# Patient Record
Sex: Female | Born: 1985 | Race: White | Hispanic: No
Health system: Southern US, Community
[De-identification: ages and names within clinical notes are randomized; demographics above are authoritative.]

## PROBLEM LIST (undated history)

## (undated) DIAGNOSIS — R011 Cardiac murmur, unspecified: Secondary | ICD-10-CM

## (undated) HISTORY — PX: TUBAL LIGATION: SHX77

## (undated) HISTORY — PX: APPENDECTOMY: SHX54

---
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
## 2019-12-19 ENCOUNTER — Other Ambulatory Visit: Payer: Self-pay

## 2019-12-19 ENCOUNTER — Other Ambulatory Visit: Payer: Self-pay | Admitting: Critical Care Medicine

## 2019-12-19 DIAGNOSIS — Z20822 Contact with and (suspected) exposure to covid-19: Secondary | ICD-10-CM

## 2019-12-22 LAB — NOVEL CORONAVIRUS, NAA: SARS-CoV-2, NAA: NOT DETECTED

## 2020-08-27 ENCOUNTER — Encounter (HOSPITAL_COMMUNITY): Payer: Self-pay

## 2020-08-27 ENCOUNTER — Other Ambulatory Visit: Payer: Self-pay

## 2020-08-27 ENCOUNTER — Emergency Department (HOSPITAL_COMMUNITY)
Admission: EM | Admit: 2020-08-27 | Discharge: 2020-08-28 | Disposition: A | Discharge status: Home / Self Care | Payer: Self-pay | Attending: Emergency Medicine | Admitting: Emergency Medicine

## 2020-08-27 ENCOUNTER — Emergency Department (HOSPITAL_COMMUNITY): Payer: Self-pay

## 2020-08-27 DIAGNOSIS — M79606 Pain in leg, unspecified: Secondary | ICD-10-CM | POA: Insufficient documentation

## 2020-08-27 DIAGNOSIS — X501XXA Overexertion from prolonged static or awkward postures, initial encounter: Secondary | ICD-10-CM | POA: Insufficient documentation

## 2020-08-27 DIAGNOSIS — Z5321 Procedure and treatment not carried out due to patient leaving prior to being seen by health care provider: Secondary | ICD-10-CM | POA: Insufficient documentation

## 2020-08-27 NOTE — ED Notes (Signed)
Pt told this tech "I'm just letting y'all know I am leaving, I have been up for over 24 hours." Moving OTF.

## 2020-08-27 NOTE — ED Provider Notes (Signed)
Emergency Medicine Provider Triage Evaluation Note  Erika Morris , a 35 y.o. female  was evaluated in triage.  Pt complains of left leg pain.  Patient states that she stepped off her porch wrong 1 week ago and thought everything was fine, has been ambulatory without difficulty until yesterday when she started having severe pain in her leg, has been limping and now has pain radiating up into her hip..  Review of Systems  Positive: Leg pain Negative: Numbness  Physical Exam  BP 126/87 (BP Location: Left Arm)   Pulse (!) 103   Temp 98.3 F (36.8 C) (Oral)   Resp 16   SpO2 97%  Gen:   Awake, no distress   Resp:  Normal effort  MSK:   TTP medial left ankle and medial lower leg Other:    Medical Decision Making  Medically screening exam initiated at 8:58 PM.  Appropriate orders placed.  Princesa Willig was informed that the remainder of the evaluation will be completed by another provider, this initial triage assessment does not replace that evaluation, and the importance of remaining in the ED until their evaluation is complete.     Jeannie Fend, PA-C 08/27/20 2059    Benjiman Core, MD 08/27/20 [phone]

## 2020-08-27 NOTE — ED Triage Notes (Signed)
Patient reports she stepped off a curb wrong last week, states it was feeling okay and then today woke up with L ankle and leg pain

## 2020-08-28 ENCOUNTER — Emergency Department (HOSPITAL_COMMUNITY): Payer: Self-pay

## 2020-08-28 ENCOUNTER — Emergency Department (HOSPITAL_BASED_OUTPATIENT_CLINIC_OR_DEPARTMENT_OTHER)
Admission: EM | Admit: 2020-08-28 | Discharge: 2020-08-29 | Disposition: A | Discharge status: Home / Self Care | Payer: Self-pay | Attending: Emergency Medicine | Admitting: Emergency Medicine

## 2020-08-28 ENCOUNTER — Encounter (HOSPITAL_BASED_OUTPATIENT_CLINIC_OR_DEPARTMENT_OTHER): Payer: Self-pay | Admitting: Emergency Medicine

## 2020-08-28 ENCOUNTER — Emergency Department (HOSPITAL_BASED_OUTPATIENT_CLINIC_OR_DEPARTMENT_OTHER): Payer: Self-pay

## 2020-08-28 ENCOUNTER — Other Ambulatory Visit: Payer: Self-pay

## 2020-08-28 DIAGNOSIS — M25552 Pain in left hip: Secondary | ICD-10-CM | POA: Insufficient documentation

## 2020-08-28 DIAGNOSIS — R200 Anesthesia of skin: Secondary | ICD-10-CM | POA: Insufficient documentation

## 2020-08-28 DIAGNOSIS — R937 Abnormal findings on diagnostic imaging of other parts of musculoskeletal system: Secondary | ICD-10-CM | POA: Insufficient documentation

## 2020-08-28 DIAGNOSIS — R531 Weakness: Secondary | ICD-10-CM | POA: Insufficient documentation

## 2020-08-28 DIAGNOSIS — W19XXXA Unspecified fall, initial encounter: Secondary | ICD-10-CM | POA: Insufficient documentation

## 2020-08-28 DIAGNOSIS — Z20822 Contact with and (suspected) exposure to covid-19: Secondary | ICD-10-CM | POA: Insufficient documentation

## 2020-08-28 DIAGNOSIS — F17210 Nicotine dependence, cigarettes, uncomplicated: Secondary | ICD-10-CM | POA: Insufficient documentation

## 2020-08-28 LAB — URINALYSIS, ROUTINE W REFLEX MICROSCOPIC
Bilirubin Urine: NEGATIVE
Glucose, UA: NEGATIVE mg/dL
Ketones, ur: NEGATIVE mg/dL
Leukocytes,Ua: NEGATIVE
Nitrite: NEGATIVE
Protein, ur: NEGATIVE mg/dL
Specific Gravity, Urine: <1.005 — ABNORMAL LOW (ref 1.005–1.030)
pH: 6 (ref 5.0–8.0)

## 2020-08-28 LAB — COMPREHENSIVE METABOLIC PANEL
ALT: 94 U/L — ABNORMAL HIGH (ref 0–44)
AST: 175 U/L — ABNORMAL HIGH (ref 15–41)
Albumin: 4.4 g/dL (ref 3.5–5.0)
Alkaline Phosphatase: 81 U/L (ref 38–126)
Anion gap: 13 (ref 5–15)
BUN: 5 mg/dL — ABNORMAL LOW (ref 6–20)
CO2: 24 mmol/L (ref 22–32)
Calcium: 9.3 mg/dL (ref 8.9–10.3)
Chloride: 102 mmol/L (ref 98–111)
Creatinine, Ser: 0.35 mg/dL — ABNORMAL LOW (ref 0.44–1.00)
GFR, Estimated: >60 mL/min (ref >60)
Glucose, Bld: 76 mg/dL (ref 70–99)
Potassium: 3.8 mmol/L (ref 3.5–5.1)
Sodium: 139 mmol/L (ref 135–145)
Total Bilirubin: 0.9 mg/dL (ref 0.3–1.2)
Total Protein: 8.2 g/dL — ABNORMAL HIGH (ref 6.5–8.1)

## 2020-08-28 LAB — DIFFERENTIAL
Abs Immature Granulocytes: 0.03 10*3/uL (ref 0.00–0.07)
Basophils Absolute: 0 10*3/uL (ref 0.0–0.1)
Basophils Relative: 1 %
Eosinophils Absolute: 0.1 10*3/uL (ref 0.0–0.5)
Eosinophils Relative: 4 %
Immature Granulocytes: 1 %
Lymphocytes Relative: 37 %
Lymphs Abs: 1.2 10*3/uL (ref 0.7–4.0)
Monocytes Absolute: 0.4 10*3/uL (ref 0.1–1.0)
Monocytes Relative: 14 %
Neutro Abs: 1.4 10*3/uL — ABNORMAL LOW (ref 1.7–7.7)
Neutrophils Relative %: 43 %

## 2020-08-28 LAB — RAPID URINE DRUG SCREEN, HOSP PERFORMED
Amphetamines: NOT DETECTED
Barbiturates: NOT DETECTED
Benzodiazepines: NOT DETECTED
Cocaine: NOT DETECTED
Opiates: NOT DETECTED
Tetrahydrocannabinol: NOT DETECTED

## 2020-08-28 LAB — PREGNANCY, URINE: Preg Test, Ur: NEGATIVE

## 2020-08-28 LAB — APTT: aPTT: 32 seconds (ref 24–36)

## 2020-08-28 LAB — CBC
HCT: 39.8 % (ref 36.0–46.0)
Hemoglobin: 13.7 g/dL (ref 12.0–15.0)
MCH: 33.8 pg (ref 26.0–34.0)
MCHC: 34.4 g/dL (ref 30.0–36.0)
MCV: 98.3 fL (ref 80.0–100.0)
Platelets: 78 10*3/uL — ABNORMAL LOW (ref 150–400)
RBC: 4.05 MIL/uL (ref 3.87–5.11)
RDW: 11.5 % (ref 11.5–15.5)
WBC: 3.3 10*3/uL — ABNORMAL LOW (ref 4.0–10.5)
nRBC: 0 % (ref 0.0–0.2)

## 2020-08-28 LAB — PROTIME-INR
INR: 0.9 (ref 0.8–1.2)
Prothrombin Time: 12.4 seconds (ref 11.4–15.2)

## 2020-08-28 LAB — RESP PANEL BY RT-PCR (FLU A&B, COVID) ARPGX2
Influenza A by PCR: NEGATIVE
Influenza B by PCR: NEGATIVE
SARS Coronavirus 2 by RT PCR: NEGATIVE

## 2020-08-28 MED ORDER — LORAZEPAM 2 MG/ML IJ SOLN
1.0000 mg | Freq: Once | INTRAMUSCULAR | Status: AC
  Administered 2020-08-28: 1 mg via INTRAVENOUS
  Filled 2020-08-28: qty 1

## 2020-08-28 MED ORDER — GADOBUTROL 1 MMOL/ML IV SOLN
6.0000 mL | Freq: Once | INTRAVENOUS | Status: AC | PRN
  Administered 2020-08-28: 6 mL via INTRAVENOUS

## 2020-08-28 MED ORDER — KETOROLAC TROMETHAMINE 30 MG/ML IJ SOLN
15.0000 mg | Freq: Once | INTRAMUSCULAR | Status: AC
  Administered 2020-08-28: 15 mg via INTRAVENOUS
  Filled 2020-08-28: qty 1

## 2020-08-28 NOTE — ED Notes (Signed)
Dr. Rhunette Croft completed first [facility]. On checking modified. Pt had left sided deficits to extinction  left arm, leg and shoulder. Drift to left arm and  Pt unable to feel touch to left leg. Left pupil appears slightly larger than right. Pt failed stroke swallow screen. Pt complains of intermittent headaches Dr. Rhunette Croft notified of same. Pt transported to CT

## 2020-08-28 NOTE — ED Triage Notes (Signed)
Approximately a week ago the Pt stepped off a curb and woke up with left leg pain to her knee and hip.

## 2020-08-28 NOTE — ED Notes (Signed)
Report called to Consulting civil engineer at [facility]

## 2020-08-28 NOTE — ED Triage Notes (Signed)
Pt here via [facility] from [facility].  She was initially seen for L leg pain r/t fall several weeks ago.  However, while being assessed at [facility], she noted decreased L arm strength and failed her swallow screen.  Pt ao x 4.

## 2020-08-28 NOTE — ED Provider Notes (Addendum)
[facility] EMERGENCY DEPT Provider Note   CSN: [id number] Arrival date & time: 08/28/20  1143     History Chief Complaint  Patient presents with  . Leg Injury    Erika Morris is a 35 y.o. female.  HPI    35 year old female comes in a chief complaint of leg injury. She reports that Wednesday before last, she had a fall while she was getting ready to go to work.  She has been hobbling over her left lower extremity since then, but now she is having hip pain and more difficulty walking, prompting her to come to the ER.  On our exam, it appears that patient is also having numbness on the left side.  She denies any new back pain, reports chronic pain history to her back.  She has no medical history, but does not see PCP.  She smokes at least a pack of cigarettes every day and has been doing that for the last 15 to 19 years.  Patient denies any chest pain, headaches, visual deficits, new back pain, bilateral extremity symptoms such as weakness or numbness.  History reviewed. No pertinent past medical history.  There are no problems to display for this patient.   Past Surgical History:  Procedure Laterality Date  . APPENDECTOMY       OB History   No obstetric history on file.     No family history on file.  Social History   Substance Use Topics  . Alcohol use: Yes    Comment: Socail drink  . Drug use: Yes    Types: Marijuana    Home Medications Prior to Admission medications   Not on File    Allergies    Patient has no known allergies.  Review of Systems   Review of Systems  Constitutional: Positive for activity change.  Respiratory: Negative for shortness of breath.   Cardiovascular: Negative for chest pain.  Gastrointestinal: Negative for nausea and vomiting.  Musculoskeletal: Positive for arthralgias.  Allergic/Immunologic: Negative for immunocompromised state.  Neurological: Positive for weakness.  Hematological: Bruises/bleeds  easily.  All other systems reviewed and are negative.   Physical Exam Updated Vital Signs BP 125/89   Pulse 70   Temp 99 F (37.2 C) (Oral)   Resp 14   Ht 5\' 8"  (1.727 m)   Wt 61.2 kg   LMP 08/25/2020   SpO2 99%   BMI 20.53 kg/m   Physical Exam Vitals and nursing note reviewed.  Constitutional:      Appearance: She is well-developed.  HENT:     Head: Normocephalic and atraumatic.  Eyes:     Pupils: Pupils are equal, round, and reactive to light.  Cardiovascular:     Rate and Rhythm: Normal rate and regular rhythm.     Heart sounds: Normal heart sounds. No murmur heard.   Pulmonary:     Effort: Pulmonary effort is normal. No respiratory distress.  Abdominal:     General: There is no distension.     Palpations: Abdomen is soft.     Tenderness: There is no abdominal tenderness. There is no guarding or rebound.  Musculoskeletal:     Cervical back: Neck supple.  Skin:    General: Skin is warm and dry.  Neurological:     Mental Status: She is alert and oriented to person, place, and time.     Comments: Left-sided hemisensory deficit over the face, upper and lower extremity.  Lower extremity appears to be impacted but worse.  Left  upper and lower extremity weakness, with drift of the left upper extremity     ED Results / Procedures / Treatments   Labs (all labs ordered are listed, but only abnormal results are displayed) Labs Reviewed  CBC - Abnormal; Notable for the following components:      Result Value   WBC 3.3 (*)    Platelets 78 (*)    All other components within normal limits  DIFFERENTIAL - Abnormal; Notable for the following components:   Neutro Abs 1.4 (*)    All other components within normal limits  COMPREHENSIVE METABOLIC PANEL - Abnormal; Notable for the following components:   BUN 5 (*)    Creatinine, Ser 0.35 (*)    Total Protein 8.2 (*)    AST 175 (*)    ALT 94 (*)    All other components within normal limits  URINALYSIS, ROUTINE W REFLEX  MICROSCOPIC - Abnormal; Notable for the following components:   Color, Urine COLORLESS (*)    Specific Gravity, Urine <1.005 (*)    Hgb urine dipstick SMALL (*)    Bacteria, UA FEW (*)    All other components within normal limits  RESP PANEL BY RT-PCR (FLU A&B, COVID) ARPGX2  PROTIME-INR  APTT  RAPID URINE DRUG SCREEN, HOSP PERFORMED  PREGNANCY, URINE    EKG EKG Interpretation  Date/Time:  Saturday Aug 28 2020 13:42:14 EDT Ventricular Rate:  65 PR Interval:  142 QRS Duration: 107 QT Interval:  431 QTC Calculation: 449 R Axis:   78 Text Interpretation: Sinus rhythm Anterior infarct, old No acute changes No significant change since last tracing Confirmed by Derwood Kaplan [phone]) on 08/28/2020 3:50:27 PM   Radiology DG Tibia/Fibula Left  Result Date: 08/27/2020 CLINICAL DATA:  Fall EXAM: LEFT TIBIA AND FIBULA - 2 VIEW COMPARISON:  None. FINDINGS: There is no evidence of fracture or other focal bone lesions. Soft tissues are unremarkable. IMPRESSION: Negative. Electronically Signed   By: Deatra Robinson M.D.   On: 08/27/2020 21:26   DG Ankle Complete Left  Result Date: 08/27/2020 CLINICAL DATA:  Fall EXAM: LEFT ANKLE COMPLETE - 3+ VIEW COMPARISON:  None. FINDINGS: There is no evidence of fracture, dislocation, or joint effusion. There is no evidence of arthropathy or other focal bone abnormality. Soft tissues are unremarkable. IMPRESSION: Negative. Electronically Signed   By: Deatra Robinson M.D.   On: 08/27/2020 21:26   CT HEAD WO CONTRAST  Result Date: 08/28/2020 CLINICAL DATA:  LEFT-sided weakness. Weaker on the LEFT than the RIGHT, began 1 week ago. EXAM: CT HEAD WITHOUT CONTRAST TECHNIQUE: Contiguous axial images were obtained from the base of the skull through the vertex without intravenous contrast. COMPARISON:  None. FINDINGS: Brain: No evidence of acute infarction, hemorrhage, hydrocephalus, extra-axial collection or mass lesion/mass effect. Signs of mild atrophy. Vascular: No  hyperdense vessel or unexpected calcification. Skull: Normal. Negative for fracture or focal lesion. Sinuses/Orbits: Visualized paranasal sinuses and orbits are unremarkable. Other: None IMPRESSION: 1. No acute intracranial abnormality. 2. Signs of mild atrophy. Electronically Signed   By: Donzetta Kohut M.D.   On: 08/28/2020 14:33    Procedures Procedures   Medications Ordered in ED Medications  ketorolac (TORADOL) 30 MG/ML injection 15 mg (15 mg Intravenous Given 08/28/20 1512)    ED Course  I have reviewed the triage vital signs and the nursing notes.  Pertinent labs & imaging results that were available during my care of the patient were reviewed by me and considered in my medical decision  making (see chart for details).    MDM Rules/Calculators/A&P                          35 year old comes in a chief complaint of fall and left-sided hip pain.  She reports that she fell and ever since has been having difficulty walking because of pain, now she has had worsening of her symptoms.  On exam, it is noted that she has left-sided hemisensory deficits and also left upper and lower extremity weakness.  She is a heavy smoker, but has no other medical problems.  Clinical concerns are high for ischemic stroke.  It is possible that the fall itself was because of stroke.  Last normal was more than 1-1/2-week ago, vision does not have a PCP and she is disabled from this deficit.  No chest pain, back pain, cardiac exam reassuring, vascular exam reveals bilateral equal pulse over the DP, radial artery -doubt dissection.  If there was thrombosis or dissection, the symptoms likely would have been bilateral.  CT head is negative. We will transfer to [facility] for [facility].  Of note, patient is noted to have low platelet count.  She denies headaches.  Doubt this is TTP/ITP.  If MRI is negative, patient will likely still need neuro consultation for her weakness.  Dr. Silverio Lay accepting at [facility].  Final Clinical Impression(s) / ED Diagnoses Final diagnoses:  Acute left-sided weakness  Left sided numbness    Rx / DC Orders ED Discharge Orders    None       Derwood Kaplan, MD 08/28/20 1553    Derwood Kaplan, MD 08/29/20 [phone]

## 2020-08-28 NOTE — ED Notes (Signed)
Patient transported to CT 

## 2020-08-28 NOTE — ED Provider Notes (Signed)
Transferred from previous provider at [facility]SH see note for full HPI.  Patient initially came in for complaints of leg injury.  Happened a week from Wednesday.  Had a fall while she was getting ready to go to work.  Since then she has been having pain, and weakness to her left lower extremity which has now extended into her upper extremity.  She denies any new back pain.  Reports history of chronic back pain.  No history of IV drug use.  Patient states she now has decreased sensation to her left face, left upper extremity left lower extremity. Physical Exam  BP 124/80 (BP Location: Right Arm)   Pulse 66   Temp 99 F (37.2 C) (Oral)   Resp 16   Ht 5\' 8"  (1.727 m)   Wt 61.2 kg   LMP 08/25/2020   SpO2 99%   BMI 20.53 kg/m   Physical Exam Vitals and nursing note reviewed.  Constitutional:      General: She is not in acute distress.    Appearance: She is well-developed.  HENT:     Head: Atraumatic.  Eyes:     Pupils: Pupils are equal, round, and reactive to light.  Cardiovascular:     Rate and Rhythm: Normal rate.  Pulmonary:     Effort: No respiratory distress.  Abdominal:     General: There is no distension.  Musculoskeletal:        General: Normal range of motion.     Cervical back: Normal range of motion.  Skin:    General: Skin is warm and dry.  Neurological:     Mental Status: She is alert.     Comments: Decreased sensation to left face, left upper extremity left lower extremity. Unable to grasp at left hand Able to lift LLE and LUE approximately 4 in off bed, however not with resistance     ED Course/Procedures     Procedures Labs Reviewed  CBC - Abnormal; Notable for the following components:      Result Value   WBC 3.3 (*)    Platelets 78 (*)    All other components within normal limits  DIFFERENTIAL - Abnormal; Notable for the following components:   Neutro Abs 1.4 (*)    All other components within normal limits  COMPREHENSIVE METABOLIC PANEL - Abnormal;  Notable for the following components:   BUN 5 (*)    Creatinine, Ser 0.35 (*)    Total Protein 8.2 (*)    AST 175 (*)    ALT 94 (*)    All other components within normal limits  URINALYSIS, ROUTINE W REFLEX MICROSCOPIC - Abnormal; Notable for the following components:   Color, Urine COLORLESS (*)    Specific Gravity, Urine <1.005 (*)    Hgb urine dipstick SMALL (*)    Bacteria, UA FEW (*)    All other components within normal limits  RESP PANEL BY RT-PCR (FLU A&B, COVID) ARPGX2  PROTIME-INR  APTT  RAPID URINE DRUG SCREEN, HOSP PERFORMED  PREGNANCY, URINE  DG Tibia/Fibula Left  Result Date: 08/27/2020 CLINICAL DATA:  Fall EXAM: LEFT TIBIA AND FIBULA - 2 VIEW COMPARISON:  None. FINDINGS: There is no evidence of fracture or other focal bone lesions. Soft tissues are unremarkable. IMPRESSION: Negative. Electronically Signed   By: 08/29/2020 M.D.   On: 08/27/2020 21:26   DG Ankle Complete Left  Result Date: 08/27/2020 CLINICAL DATA:  Fall EXAM: LEFT ANKLE COMPLETE - 3+ VIEW COMPARISON:  None. FINDINGS: There  is no evidence of fracture, dislocation, or joint effusion. There is no evidence of arthropathy or other focal bone abnormality. Soft tissues are unremarkable. IMPRESSION: Negative. Electronically Signed   By: Deatra Robinson M.D.   On: 08/27/2020 21:26   CT HEAD WO CONTRAST  Result Date: 08/28/2020 CLINICAL DATA:  LEFT-sided weakness. Weaker on the LEFT than the RIGHT, began 1 week ago. EXAM: CT HEAD WITHOUT CONTRAST TECHNIQUE: Contiguous axial images were obtained from the base of the skull through the vertex without intravenous contrast. COMPARISON:  None. FINDINGS: Brain: No evidence of acute infarction, hemorrhage, hydrocephalus, extra-axial collection or mass lesion/mass effect. Signs of mild atrophy. Vascular: No hyperdense vessel or unexpected calcification. Skull: Normal. Negative for fracture or focal lesion. Sinuses/Orbits: Visualized paranasal sinuses and orbits are  unremarkable. Other: None IMPRESSION: 1. No acute intracranial abnormality. 2. Signs of mild atrophy. Electronically Signed   By: Donzetta Kohut M.D.   On: 08/28/2020 14:33   MR BRAIN WO CONTRAST  Result Date: 08/28/2020 CLINICAL DATA:  Acute neurologic deficit EXAM: MRI HEAD WITHOUT CONTRAST TECHNIQUE: Multiplanar, multiecho pulse sequences of the brain and surrounding structures were obtained without intravenous contrast. COMPARISON:  None. FINDINGS: Brain: No acute infarct, mass effect or extra-axial collection. No acute or chronic hemorrhage. Normal white matter signal, parenchymal volume and CSF spaces. The midline structures are normal. Vascular: Major flow voids are preserved. Skull and upper cervical spine: Normal calvarium and skull base. Visualized upper cervical spine and soft tissues are normal. Sinuses/Orbits:No paranasal sinus fluid levels or advanced mucosal thickening. No mastoid or middle ear effusion. Normal orbits. IMPRESSION: Normal brain MRI. Electronically Signed   By: Deatra Robinson M.D.   On: 08/28/2020 20:09   MDM  Patient received in transfer from [facility].  Had come in for left leg pain, weakness and paresthesias.  Paresthesias to left face, left arm and left leg.  Head CT negative.  Patient transferred for MRI and likely neurology consultation.  CONSULT with Dr. Iver Nestle with Neurology recommends MR brain, C/T spine W contrast  Care transferred to Dr. Pilar Plate who will follow up on imaging, neurology recommendations and dispo appropriately        Aayra Hornbaker A, PA-C 08/28/20 2345    Charlynne Pander, MD 08/29/20 [phone]

## 2020-08-28 NOTE — Consult Note (Incomplete)
Neurology Consultation Reason for Consult: Left-sided weakness  Requesting Physician: Chaney Malling  CC:   History is obtained from:***  HPI: Erika Morris is a 35 y.o. female ***   LKW: *** tPA given?: No, or if yes, time given *** IA performed?: No, or if yes, groin puncture time: *** Premorbid modified rankin scale: ***     0 - No symptoms.     1 - No significant disability. Able to carry out all usual activities, despite some symptoms.     2 - Slight disability. Able to look after own affairs without assistance, but unable to carry out all previous activities.     3 - Moderate disability. Requires some help, but able to walk unassisted.     4 - Moderately severe disability. Unable to attend to own bodily needs without assistance, and unable to walk unassisted.     5 - Severe disability. Requires constant nursing care and attention, bedridden, incontinent.     6 - Dead. ICH Score: ***  Time performed: *** GCS: {Blank single:19197::"3-4 is 2 points","5-12 is 1 point","13-15 is 0 points"} Infratentorial: {yes no:314532}. If yes, 1 point Volume: {Blank single:19197::">30cc is 1 point","<30cc is 0 points"}  Age: 35 y.o.. >80 is 1 point Intraventricular extension is 1 point  Score:***  A Score of {Blank single:19197::"0 points has a 30 day mortality of 0%","1 points has a 30 day mortality of 13%","2 points has a 30 day mortality of 26%","3 points has a 30 day mortality of 72%","4 points has a 30 day mortality of 97%","5-6 points has a 30 day mortality of 100%"}. Stroke. 2001 Apr;32(4):891-7.    ROS: All other review of systems was negative except as noted in the HPI. *** Unable to obtain due to altered mental status.   History reviewed. No pertinent past medical history. ***  No family history on file. ***  Social History:  reports that she has been smoking. She has been smoking about 1.50 packs per day. She has never used smokeless tobacco. She reports current alcohol use. She  reports current drug use. Drug: Marijuana. ***  Exam: Current vital signs: BP 124/80 (BP Location: Right Arm)   Pulse 66   Temp 99 F (37.2 C) (Oral)   Resp 16   Ht 5\' 8"  (1.727 m)   Wt 61.2 kg   LMP 08/25/2020   SpO2 99%   BMI 20.53 kg/m  Vital signs in last 24 hours: Temp:  [99 F (37.2 C)] 99 F (37.2 C) (05/21 1159) Pulse Rate:  [66-89] 66 (05/21 1941) Resp:  [8-20] 16 (05/21 1941) BP: (124-150)/(80-107) 124/80 (05/21 1941) SpO2:  [99 %-100 %] 99 % (05/21 1941) Weight:  [61.2 kg] 61.2 kg (05/21 1200)   Physical Exam  Constitutional: Appears well-developed and well-nourished.  Psych: Affect appropriate to situation, *** Eyes: No scleral injection HENT: No oropharyngeal obstruction.  MSK: no joint deformities.  Cardiovascular: Normal rate and regular rhythm.  Respiratory: Effort normal, non-labored breathing GI: Soft.  No distension. There is no tenderness.  Skin: Warm dry and intact visible skin  Neuro: Mental Status: Patient is awake, alert, oriented to person, place, month, year, and situation.*** Patient is able to give a clear and coherent history.*** No signs of aphasia or neglect*** Cranial Nerves: II: Visual Fields are full. Pupils are equal, round, and reactive to light.  *** III,IV, VI: EOMI without ptosis or diploplia.  V: Facial sensation is symmetric to temperature VII: Facial movement is symmetric.  VIII: hearing is intact  to voice X: Uvula elevates symmetrically XI: Shoulder shrug is symmetric. XII: tongue is midline without atrophy or fasciculations.  Motor: Tone is normal. Bulk is normal. 5/5 strength was present in all four extremities. *** Sensory: Sensation is symmetric to light touch and temperature in the arms and legs.*** Deep Tendon Reflexes: 2+ and symmetric in the biceps and patellae. *** Plantars: Toes are downgoing bilaterally. *** Cerebellar: FNF and HKS are intact bilaterally***  NIHSS total *** Score breakdown:  ***    I have reviewed labs in epic and the results pertinent to this consultation are: ***  I have reviewed the images obtained:***   Impression: ***  Recommendations: - ***   Brooke Dare MD-PhD [facility] [phone]   *** [facility], [facility], Teleneuro    Total critical care time: *** minutes   Critical care time was exclusive of separately billable procedures and treating other patients.   Critical care was necessary to treat or prevent imminent or life-threatening deterioration.   Critical care was time spent personally by me on the following activities: development of treatment plan with patient and/or surrogate as well as nursing, discussions with consultants/primary team, evaluation of patient's response to treatment, examination of patient, obtaining history from patient or surrogate, ordering and performing treatments and interventions, ordering and review of laboratory studies, ordering and review of radiographic studies, and re-evaluation of patient's condition as needed, as documented above.

## 2020-08-28 NOTE — ED Notes (Signed)
Joe - RN aware of pt's BP

## 2020-08-29 NOTE — ED Provider Notes (Signed)
  Provider Note MRN:  [id number]  Arrival date & time: 08/29/20    ED Course and Medical Decision Making  Assumed care from PA Brittany Henderly at shift change.  MRI imaging is reassuring, no signs of stroke or cord injury to explain patient's symptoms.  On my exam the weakness seems due to pain.  Patient is comfortable with discharge and follow-up with neurology.  Procedures  Final Clinical Impressions(s) / ED Diagnoses     ICD-10-CM   1. Acute left-sided weakness  R53.1   2. Left sided numbness  R20.0     ED Discharge Orders         Ordered    Ambulatory referral to Neurology       Comments: An appointment is requested in approximately: 2 weeks   08/29/20 0017            Discharge Instructions     You were evaluated in the Emergency Department and after careful evaluation, we did not find any emergent condition requiring admission or further testing in the hospital.  Your exam/testing today is overall reassuring.  MRIs did not show any signs of stroke or spinal cord injury.  Recommend follow-up with the neurologist for further management of your symptoms.  Please return to the Emergency Department if you experience any worsening of your condition.   Thank you for allowing [place] to be a part of your care.    Elmer Sow. Pilar Plate, MD [facility] Emergency Medicine [facility] [email or web address]    Sabas Sous, MD 08/29/20 [phone]

## 2020-08-29 NOTE — Discharge Instructions (Addendum)
You were evaluated in the Emergency Department and after careful evaluation, we did not find any emergent condition requiring admission or further testing in the hospital.  Your exam/testing today is overall reassuring.  MRIs did not show any signs of stroke or spinal cord injury.  Recommend follow-up with the neurologist for further management of your symptoms.  Please return to the Emergency Department if you experience any worsening of your condition.   Thank you for allowing [place] to be a part of your care.

## 2020-08-30 ENCOUNTER — Encounter (HOSPITAL_BASED_OUTPATIENT_CLINIC_OR_DEPARTMENT_OTHER): Payer: Self-pay | Admitting: Emergency Medicine

## 2020-09-02 ENCOUNTER — Ambulatory Visit (HOSPITAL_COMMUNITY): Admission: RE | Admit: 2020-09-02 | Payer: Self-pay | Source: Ambulatory Visit

## 2020-09-02 ENCOUNTER — Other Ambulatory Visit (HOSPITAL_COMMUNITY): Payer: Self-pay | Admitting: Orthopedic Surgery

## 2020-09-02 DIAGNOSIS — M79605 Pain in left leg: Secondary | ICD-10-CM

## 2020-09-02 DIAGNOSIS — M7989 Other specified soft tissue disorders: Secondary | ICD-10-CM

## 2020-10-03 ENCOUNTER — Other Ambulatory Visit: Payer: Self-pay

## 2020-10-03 ENCOUNTER — Encounter (HOSPITAL_COMMUNITY): Payer: Self-pay

## 2020-10-03 ENCOUNTER — Emergency Department (HOSPITAL_COMMUNITY)
Admission: EM | Admit: 2020-10-03 | Discharge: 2020-10-03 | Disposition: A | Discharge status: Home / Self Care | Payer: Self-pay | Attending: Emergency Medicine | Admitting: Emergency Medicine

## 2020-10-03 ENCOUNTER — Emergency Department (HOSPITAL_COMMUNITY): Payer: Self-pay

## 2020-10-03 DIAGNOSIS — F17210 Nicotine dependence, cigarettes, uncomplicated: Secondary | ICD-10-CM | POA: Insufficient documentation

## 2020-10-03 DIAGNOSIS — M25562 Pain in left knee: Secondary | ICD-10-CM

## 2020-10-03 DIAGNOSIS — Y990 Civilian activity done for income or pay: Secondary | ICD-10-CM | POA: Insufficient documentation

## 2020-10-03 DIAGNOSIS — W010XXA Fall on same level from slipping, tripping and stumbling without subsequent striking against object, initial encounter: Secondary | ICD-10-CM | POA: Insufficient documentation

## 2020-10-03 MED ORDER — ACETAMINOPHEN 325 MG PO TABS
325.0000 mg | ORAL_TABLET | Freq: Once | ORAL | Status: AC
  Administered 2020-10-03: 325 mg via ORAL
  Filled 2020-10-03: qty 1

## 2020-10-03 NOTE — ED Triage Notes (Signed)
Patient coming from work with c/o fall. Patient recently had surgery to the left knee about a week ago to the left knee. Patient fell today to the left knee with bruising and swelling.

## 2020-10-03 NOTE — ED Provider Notes (Signed)
[facility]-EMERGENCY DEPT Provider Note   CSN: [id number] Arrival date & time: 10/03/20  1002     History No chief complaint on file.   Leta Bucklin is a 35 y.o. female presents to the ER for evaluation of left knee injury.  Patient reports that she had a left meniscal repair at [facility] orthopedic office around 1 week ago.  Patient patient reports that she tripped over her shoe this morning following directly onto her left patella.  She reports pain only occurs with movement it is aching mild constant and improves with rest.  She reports pain does not radiate.  Patient is concerned she may have reinjured her meniscus.  Additionally patient reports she drank beer this morning, reports she does not feel intoxicated and patient clinically does not appear intoxicated.  Denies head injury, loss of consciousness, headache, vision changes, neck pain, back pain, chest pain, abdominal pain, upper extremity injury, right lower extremity injury or any additional concerns  HPI     No past medical history on file.  There are no problems to display for this patient.   Past Surgical History:  Procedure Laterality Date   APPENDECTOMY       OB History   No obstetric history on file.     History reviewed. No pertinent family history.  Social History   Tobacco Use   Smoking status: Every Day    Packs/day: 1.50    Pack years: 0.00    Types: Cigarettes   Smokeless tobacco: Never  Substance Use Topics   Alcohol use: Yes    Comment: Socail drink   Drug use: Yes    Types: Marijuana    Home Medications Prior to Admission medications   Not on File    Allergies    Patient has no known allergies.  Review of Systems   Review of Systems Ten systems are reviewed and are negative for acute change except as noted in the HPI  Physical Exam Updated Vital Signs BP 125/90   Pulse 90   Temp 98 F (36.7 C)   Resp 16   LMP 10/03/2020   SpO2 97%    Physical Exam Constitutional:      General: She is not in acute distress.    Appearance: Normal appearance. She is well-developed. She is not ill-appearing or diaphoretic.  HENT:     Head: Normocephalic and atraumatic.  Eyes:     General: Vision grossly intact. Gaze aligned appropriately.     Pupils: Pupils are equal, round, and reactive to light.  Neck:     Trachea: Trachea and phonation normal.  Cardiovascular:     Pulses:          Dorsalis pedis pulses are 2+ on the right side and 2+ on the left side.  Pulmonary:     Effort: Pulmonary effort is normal. No respiratory distress.  Abdominal:     General: There is no distension.     Palpations: Abdomen is soft.     Tenderness: There is no abdominal tenderness. There is no guarding or rebound.  Musculoskeletal:        General: Normal range of motion.     Cervical back: Normal range of motion.     Right lower leg: No edema.     Left lower leg: No edema.     Comments: Left knee: Suture site present anterior knee, no dehiscence, no erythema, no drainage, no increased warmth, no fluctuance.  Mild tenderness along the patella  only.  No tenderness of the quadriceps, medial/lateral joint line, tibial plateau or popliteal fossa.  Patient is able to extend the knee with some pain, range of motion limited secondary to pain.  Compartments soft.  Good range of motion at the hip and ankle without pain.  Strong equal pedal pulses.  Compartments soft.  Capillary refill and sensation intact to all toes.    Feet:     Right foot:     Protective Sensation: 3 sites tested.  3 sites sensed.     Left foot:     Protective Sensation: 3 sites tested.  3 sites sensed.  Skin:    General: Skin is warm and dry.  Neurological:     Mental Status: She is alert.     GCS: GCS eye subscore is 4. GCS verbal subscore is 5. GCS motor subscore is 6.     Comments: Speech is clear and goal oriented, follows commands Major Cranial nerves without deficit, no facial  droop Moves extremities without ataxia, coordination intact  Psychiatric:        Behavior: Behavior normal.    ED Results / Procedures / Treatments   Labs (all labs ordered are listed, but only abnormal results are displayed) Labs Reviewed - No data to display  EKG None  Radiology DG Knee Complete 4 Views Left  Result Date: 10/03/2020 CLINICAL DATA:  Fall today.  Meniscal repair earlier this week. EXAM: LEFT KNEE - COMPLETE 4+ VIEW COMPARISON:  08/27/2020. FINDINGS: No fracture or bone lesion. Knee joint normally spaced and aligned. No arthropathic changes. No joint effusion. Soft tissues are unremarkable. IMPRESSION: Negative. Electronically Signed   By: Amie Portland M.D.   On: 10/03/2020 11:26    Procedures Procedures   Medications Ordered in ED Medications  acetaminophen (TYLENOL) tablet 325 mg (325 mg Oral Given 10/03/20 1148)    ED Course  I have reviewed the triage vital signs and the nursing notes.  Pertinent labs & imaging results that were available during my care of the patient were reviewed by me and considered in my medical decision making (see chart for details).    MDM Rules/Calculators/A&P                         Additional history obtained from: Nursing notes from this visit. ------------- 36 year old female presents following a mechanical trip and fall onto her left knee.  Patient does report drinking beer this morning but on exam does not appear to be clinically intoxicated.  She is well-appearing no acute distress and alert and oriented x4.  Patient denies any head injury, loss of consciousness, headache, neck pain, back pain, chest pain, abdominal pain or injury of the other 3 extremities.  She is primarily concerned for meniscal injury of the left knee since she had a recent repair of that area.  On exam she has sutures intact to the left knee no overlying skin changes to suggest infection.  Range of motion slightly limited to pain on exam, she is  neurovascular intact distally.  No pain with motion at the hip or ankle and no shortening of the leg. -------------------- DG Left Knee:  IMPRESSION:  Negative.   Patient reassessed resting comfortably bed no acute distress.  Patient fully alert and oriented, does not appear intoxicated.  Patient was advised of x-ray findings.  Plan of care is to consult patient's orthopedic clinic[facility] to discuss follow-up care and if they have any other recommendations.  Shortly after leaving the room I was informed by nursing staff the patient eloped from the emergency department.  Note: Portions of this report may have been transcribed using voice recognition software. Every effort was made to ensure accuracy; however, inadvertent computerized transcription errors may still be present.  Final Clinical Impression(s) / ED Diagnoses Final diagnoses:  Left knee pain, unspecified chronicity    Rx / DC Orders ED Discharge Orders     None        Elizabeth Palau 10/03/20 1342    Lorre Nick, MD 10/03/20 [phone]

## 2020-10-03 NOTE — ED Notes (Signed)
Patient eloped. Provider made aware.

## 2020-11-23 ENCOUNTER — Ambulatory Visit: Payer: Self-pay | Admitting: Neurology

## 2021-09-02 ENCOUNTER — Other Ambulatory Visit: Payer: Self-pay | Admitting: Orthopedic Surgery

## 2021-09-02 DIAGNOSIS — M7989 Other specified soft tissue disorders: Secondary | ICD-10-CM

## 2022-08-14 ENCOUNTER — Emergency Department (HOSPITAL_COMMUNITY)
Admission: EM | Admit: 2022-08-14 | Discharge: 2022-08-14 | Disposition: A | Discharge status: Home / Self Care | Payer: 59 | Attending: Emergency Medicine | Admitting: Emergency Medicine

## 2022-08-14 ENCOUNTER — Encounter (HOSPITAL_COMMUNITY): Payer: Self-pay | Admitting: *Deleted

## 2022-08-14 ENCOUNTER — Other Ambulatory Visit: Payer: Self-pay

## 2022-08-14 DIAGNOSIS — T7805XA Anaphylactic reaction due to tree nuts and seeds, initial encounter: Secondary | ICD-10-CM | POA: Insufficient documentation

## 2022-08-14 DIAGNOSIS — T7840XA Allergy, unspecified, initial encounter: Secondary | ICD-10-CM

## 2022-08-14 HISTORY — DX: Cardiac murmur, unspecified: R01.1

## 2022-08-14 MED ORDER — METHYLPREDNISOLONE SODIUM SUCC 125 MG IJ SOLR
125.0000 mg | Freq: Once | INTRAMUSCULAR | Status: AC
  Administered 2022-08-14: 125 mg via INTRAVENOUS
  Filled 2022-08-14: qty 2

## 2022-08-14 MED ORDER — FAMOTIDINE IN NACL 20-0.9 MG/50ML-% IV SOLN
20.0000 mg | Freq: Once | INTRAVENOUS | Status: AC
  Administered 2022-08-14: 20 mg via INTRAVENOUS
  Filled 2022-08-14: qty 50

## 2022-08-14 MED ORDER — EPINEPHRINE 0.3 MG/0.3ML IJ SOAJ: 0.3000 mg | INTRAMUSCULAR | 1 refills | Status: AC | PRN

## 2022-08-14 MED ORDER — DIPHENHYDRAMINE HCL 50 MG/ML IJ SOLN
25.0000 mg | Freq: Once | INTRAMUSCULAR | Status: AC
  Administered 2022-08-14: 25 mg via INTRAVENOUS
  Filled 2022-08-14: qty 1

## 2022-08-14 NOTE — Discharge Instructions (Signed)
If you have another reaction you should take the Benadryl and give yourself an EpiPen shot.  Follow-up with the allergist to get testing.

## 2022-08-14 NOTE — ED Provider Notes (Signed)
[place] EMERGENCY DEPARTMENT AT [facility] Provider Note   CSN: [id number] Arrival date & time: 08/14/22  1307     History  Chief Complaint  Patient presents with   Allergic Reaction    Erika Morris is a 37 y.o. female.  Patient has no medical problems.  Patient was drinking a drink that had some coconut in it and started having swelling in her mouth and unable to breathe.  She was given an EpiPen and by the time she arrived to the emergency department she was back to normal  The history is provided by the patient and medical records. No language interpreter was used.  Allergic Reaction Presenting symptoms: difficulty breathing   Presenting symptoms: no rash   Severity:  Moderate Prior allergic episodes:  No prior episodes Context: food   Context: not animal exposure   Relieved by:  Nothing Worsened by:  Nothing Ineffective treatments:  None tried      Home Medications Prior to Admission medications   Medication Sig Start Date End Date Taking? Authorizing Provider  EPINEPHrine 0.3 mg/0.3 mL IJ SOAJ injection Inject 0.3 mg into the muscle as needed for anaphylaxis. 08/14/22  Yes Bethann Berkshire, MD      Allergies    Patient has no known allergies.    Review of Systems   Review of Systems  Constitutional:  Negative for appetite change and fatigue.  HENT:  Negative for congestion, ear discharge and sinus pressure.   Eyes:  Negative for discharge.  Respiratory:  Positive for shortness of breath. Negative for cough.   Cardiovascular:  Negative for chest pain.  Gastrointestinal:  Negative for abdominal pain and diarrhea.  Genitourinary:  Negative for frequency and hematuria.  Musculoskeletal:  Negative for back pain.  Skin:  Negative for rash.  Neurological:  Negative for seizures and headaches.  Psychiatric/Behavioral:  Negative for hallucinations.     Physical Exam Updated Vital Signs BP 115/74   Pulse (!) 102   Temp 98.3 F (36.8 C) (Oral)    Resp 16   LMP  (LMP Unknown)   SpO2 97%  Physical Exam Vitals and nursing note reviewed.  Constitutional:      Appearance: She is well-developed.  HENT:     Head: Normocephalic.     Nose: Nose normal.  Eyes:     General: No scleral icterus.    Conjunctiva/sclera: Conjunctivae normal.  Neck:     Thyroid: No thyromegaly.  Cardiovascular:     Rate and Rhythm: Normal rate and regular rhythm.     Heart sounds: No murmur heard.    No friction rub. No gallop.  Pulmonary:     Breath sounds: No stridor. No wheezing or rales.  Chest:     Chest wall: No tenderness.  Abdominal:     General: There is no distension.     Tenderness: There is no abdominal tenderness. There is no rebound.  Musculoskeletal:        General: Normal range of motion.     Cervical back: Neck supple.  Lymphadenopathy:     Cervical: No cervical adenopathy.  Skin:    Findings: No erythema or rash.  Neurological:     Mental Status: She is oriented to person, place, and time.     Motor: No abnormal muscle tone.     Coordination: Coordination normal.  Psychiatric:        Behavior: Behavior normal.     ED Results / Procedures / Treatments   Labs (all  labs ordered are listed, but only abnormal results are displayed) Labs Reviewed - No data to display  EKG None  Radiology No results found.  Procedures Procedures    Medications Ordered in ED Medications  famotidine (PEPCID) IVPB 20 mg premix (20 mg Intravenous New Bag/Given 08/14/22 1518)  diphenhydrAMINE (BENADRYL) injection 25 mg (25 mg Intravenous Given 08/14/22 1450)  methylPREDNISolone sodium succinate (SOLU-MEDROL) 125 mg/2 mL injection 125 mg (125 mg Intravenous Given 08/14/22 1449)    ED Course/ Medical Decision Making/ A&P                             Medical Decision Making Risk Prescription drug management.   This patient presents to the ED for concern of allergic reaction, this involves an extensive number of treatment options, and is a  complaint that carries with it a high risk of complications and morbidity.  The differential diagnosis includes side effect, allergic reaction   Co morbidities that complicate the patient evaluation  None   Additional history obtained:  Additional history obtained from patient External records from outside source obtained and reviewed including hospital records   Lab Tests:  No labs Imaging Studies ordered:  No imaging Cardiac Monitoring: / EKG:  The patient was maintained on a cardiac monitor.  I personally viewed and interpreted the cardiac monitored which showed an underlying rhythm of: Normal sinus rhythm   Consultations Obtained:  No consultant  Problem List / ED Course / Critical interventions / Medication management  Swelling to mouth I ordered medication including epinephrine, Benadryl, steroids Reevaluation of the patient after these medicines showed that the patient improved I have reviewed the patients home medicines and have made adjustments as needed   Social Determinants of Health:  None   Test / Admission - Considered:  None   Allergic reaction.  Patient has been observed appropriately in the emergency department will be discharged home and follow-up with allergist        Final Clinical Impression(s) / ED Diagnoses Final diagnoses:  Allergic reaction, initial encounter    Rx / DC Orders ED Discharge Orders          Ordered    EPINEPHrine 0.3 mg/0.3 mL IJ SOAJ injection  As needed        08/14/22 1531              Bethann Berkshire, MD 08/16/22 1539

## 2022-08-14 NOTE — ED Triage Notes (Signed)
Pt BIB [facility] for an allergic reaction after drinking a hawaiian shaved ice alani drink;  pt states she took a couple of sips and she was unable to breath or swallow  One of her co-workers used an [employer] and pt was able to breath after a few minutes  Pt states it was the first time she has had that drink

## 2022-10-10 ENCOUNTER — Emergency Department (HOSPITAL_BASED_OUTPATIENT_CLINIC_OR_DEPARTMENT_OTHER): Payer: 59 | Admitting: Radiology

## 2022-10-10 ENCOUNTER — Encounter (HOSPITAL_BASED_OUTPATIENT_CLINIC_OR_DEPARTMENT_OTHER): Payer: Self-pay

## 2022-10-10 ENCOUNTER — Other Ambulatory Visit: Payer: Self-pay

## 2022-10-10 ENCOUNTER — Emergency Department (HOSPITAL_BASED_OUTPATIENT_CLINIC_OR_DEPARTMENT_OTHER)
Admission: EM | Admit: 2022-10-10 | Discharge: 2022-10-10 | Disposition: A | Discharge status: Home / Self Care | Payer: 59 | Attending: Emergency Medicine | Admitting: Emergency Medicine

## 2022-10-10 DIAGNOSIS — Z23 Encounter for immunization: Secondary | ICD-10-CM | POA: Diagnosis not present

## 2022-10-10 DIAGNOSIS — S61212A Laceration without foreign body of right middle finger without damage to nail, initial encounter: Secondary | ICD-10-CM | POA: Diagnosis present

## 2022-10-10 DIAGNOSIS — S61214A Laceration without foreign body of right ring finger without damage to nail, initial encounter: Secondary | ICD-10-CM | POA: Insufficient documentation

## 2022-10-10 DIAGNOSIS — W268XXA Contact with other sharp object(s), not elsewhere classified, initial encounter: Secondary | ICD-10-CM | POA: Insufficient documentation

## 2022-10-10 MED ORDER — LIDOCAINE HCL 2 % IJ SOLN: 10.0000 mL | Freq: Once | INTRAMUSCULAR | Status: DC

## 2022-10-10 MED ORDER — LIDOCAINE HCL (PF) 1 % IJ SOLN
5.0000 mL | Freq: Once | INTRAMUSCULAR | Status: AC
  Administered 2022-10-10: 5 mL via INTRADERMAL
  Filled 2022-10-10: qty 5

## 2022-10-10 MED ORDER — TETANUS-DIPHTH-ACELL PERTUSSIS 5-2.5-18.5 LF-MCG/0.5 IM SUSY
0.5000 mL | PREFILLED_SYRINGE | Freq: Once | INTRAMUSCULAR | Status: AC
  Administered 2022-10-10: 0.5 mL via INTRAMUSCULAR
  Filled 2022-10-10: qty 0.5

## 2022-10-10 MED ORDER — IBUPROFEN 800 MG PO TABS
800.0000 mg | ORAL_TABLET | Freq: Once | ORAL | Status: AC
  Administered 2022-10-10: 800 mg via ORAL
  Filled 2022-10-10: qty 1

## 2022-10-10 NOTE — ED Triage Notes (Signed)
Patient here POV from Home.  Endorses lacerating her Left Anterior Third and Fourth Digits with a Kitana. Unsure of tetanus. Bleeding Controlled.   NAD Noted during triage. A&Ox4. Gcs 15. Ambulatory.

## 2022-10-10 NOTE — ED Provider Notes (Signed)
[place] EMERGENCY DEPARTMENT AT [facility] Provider Note   CSN: [id number] Arrival date & time: 10/10/22  1913     History  Chief Complaint  Patient presents with   Laceration    Erika Morris is a 37 y.o. female.  The history is provided by the patient and the spouse. No language interpreter was used.  Laceration Location:  Hand Hand laceration location:  L fingers Laceration mechanism:  Metal edge Pain details:    Quality:  Aching   Severity:  Mild   Timing:  Constant   Progression:  Worsening Relieved by:  Nothing Worsened by:  Nothing Tetanus status:  Out of date      Home Medications Prior to Admission medications   Medication Sig Start Date End Date Taking? Authorizing Provider  EPINEPHrine 0.3 mg/0.3 mL IJ SOAJ injection Inject 0.3 mg into the muscle as needed for anaphylaxis. 08/14/22   Bethann Berkshire, MD      Allergies    Patient has no known allergies.    Review of Systems   Review of Systems  All other systems reviewed and are negative.   Physical Exam Updated Vital Signs BP 102/70 (BP Location: Right Arm)   Pulse (!) 111   Temp 97.8 F (36.6 C) (Temporal)   Resp 18   Ht 5\' 8"  (1.727 m)   Wt 59 kg   SpO2 98%   BMI 19.77 kg/m  Physical Exam Vitals reviewed.  Constitutional:      Appearance: Normal appearance.  Cardiovascular:     Rate and Rhythm: Normal rate.  Musculoskeletal:     Comments: 3mm laceration 3rd finger palmar aspect,  4th finger 7mm laceration nv and ns intact   Skin:    General: Skin is warm.  Neurological:     General: No focal deficit present.     Mental Status: She is alert.  Psychiatric:        Mood and Affect: Mood normal.     ED Results / Procedures / Treatments   Labs (all labs ordered are listed, but only abnormal results are displayed) Labs Reviewed - No data to display  EKG None  Radiology DG Hand Complete Left  Result Date: 10/10/2022 CLINICAL DATA:  Third and fourth digit laceration  from sword. Left anterior third and fourth digit laceration. EXAM: LEFT HAND - COMPLETE 3+ VIEW COMPARISON:  None Available. FINDINGS: There is mild irregularity of the soft tissues volar to the distal aspect of the middle phalanx of the third finger and volar to the fourth DIP joint. Normal bone mineralization. The cortices are intact. No acute fracture or dislocation. Joint spaces are preserved. IMPRESSION: Soft tissue injury at the volar aspect of the third and fourth fingers. No acute fracture or dislocation. Electronically Signed   By: Neita Garnet M.D.   On: 10/10/2022 19:51    Procedures .Marland KitchenLaceration Repair  Date/Time: 10/10/2022 10:04 PM  Performed by: Elson Areas, PA-C Authorized by: Elson Areas, PA-C   Consent:    Consent obtained:  Verbal   Consent given by:  Patient   Alternatives discussed:  No treatment Universal protocol:    Procedure explained and questions answered to patient or proxy's satisfaction: yes     Immediately prior to procedure, a time out was called: yes     Patient identity confirmed:  Verbally with patient Laceration details:    Length (cm):  8 Exploration:    Contaminated: no   Treatment:    Area cleansed with:  Povidone-iodine   Debridement:  None   Scar revision: no   Skin repair:    Repair method:  Sutures   Suture size:  5-0   Suture material:  Prolene   Suture technique:  Simple interrupted   Number of sutures:  2 Approximation:    Approximation:  Loose Repair type:    Repair type:  Simple Post-procedure details:    Procedure completion:  Tolerated     Medications Ordered in ED Medications  ibuprofen (ADVIL) tablet 800 mg (800 mg Oral Given 10/10/22 2038)  Tdap (BOOSTRIX) injection 0.5 mL (0.5 mLs Intramuscular Given 10/10/22 2037)  lidocaine (PF) (XYLOCAINE) 1 % injection 5 mL (5 mLs Intradermal Given 10/10/22 2119)    ED Course/ Medical Decision Making/ A&P                             Medical Decision Making Amount and/or  Complexity of Data Reviewed Radiology: ordered and independent interpretation performed. Decision-making details documented in ED Course.    Details: Xray  right hand  no fracture   Risk Prescription drug management.           Final Clinical Impression(s) / ED Diagnoses Final diagnoses:  Laceration of right middle finger, foreign body presence unspecified, nail damage status unspecified, initial encounter  Laceration of right ring finger, foreign body presence unspecified, nail damage status unspecified, initial encounter    Rx / DC Orders ED Discharge Orders     None      An After Visit Summary was printed and given to the patient.    Elson Areas, Cordelia Poche 10/10/22 2205    Glyn Ade, MD 10/11/22 1505

## 2022-10-10 NOTE — Discharge Instructions (Addendum)
Suture removal in 8 days  °

## 2022-10-10 NOTE — ED Notes (Signed)
Dressing placed to both right fingers, 3rd and 4th. Patient voiced understanding of dressing changes and wound care

## 2022-12-10 IMAGING — MR MR HEAD W/O CM
7 of 11 series · 25 of 48 positions shown · non-contrast
Comparison: None.

CLINICAL DATA: Acute neurologic deficit

EXAM:
MRI HEAD WITHOUT CONTRAST
TECHNIQUE: Multiplanar, multiecho pulse sequences of the brain and surrounding
structures were obtained without intravenous contrast.

[Series 2: DWI · axial · 3.0mm · 0.94mm/px · z∈[-75,+71]mm · 7 of 104 slices shown (1 of 2)]
[im 1/104]
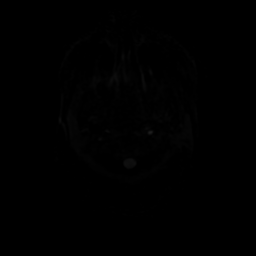
[im 18/104]
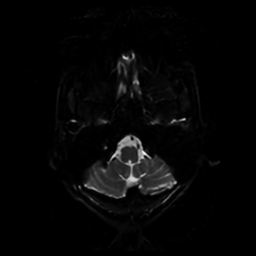
[im 35/104]
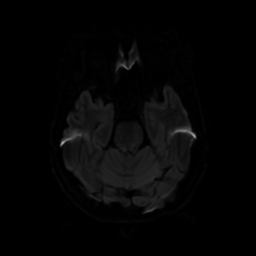
[im 52/104]
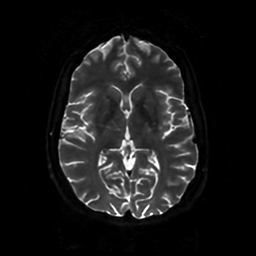
[im 69/104]
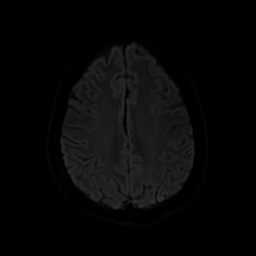
[im 86/104]
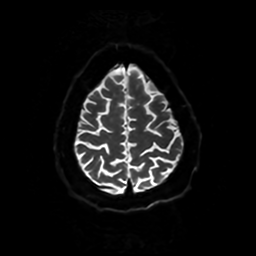
[im 104/104]
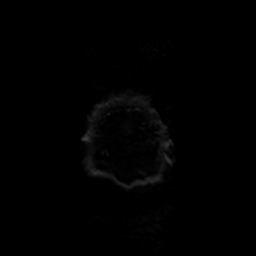

[Series 3: DWI · coronal · 4.0mm · 0.94mm/px · 6 of 73 slices shown (2 of 2)]
[im 1/73]
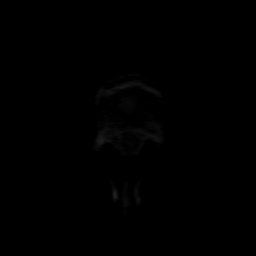
[im 15/73]
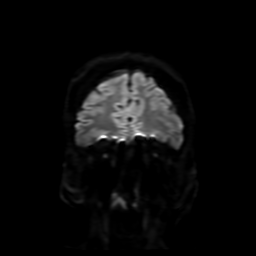
[im 29/73]
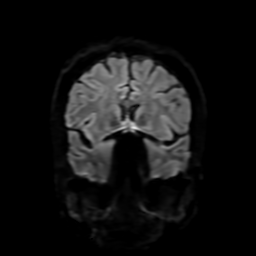
[im 44/73]
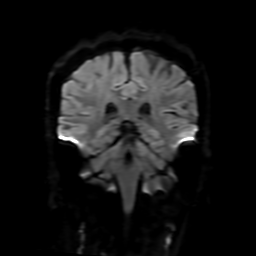
[im 58/73]
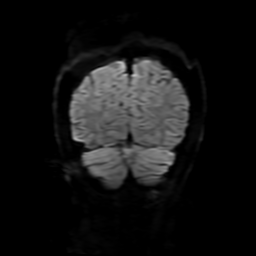
[im 73/73]
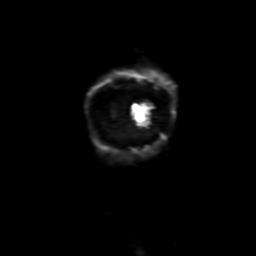

[Series 4: FLAIR · sagittal · 5.0mm · 0.23mm/px · 2 of 24 slices shown (1 of 2)]
[im 1/24]
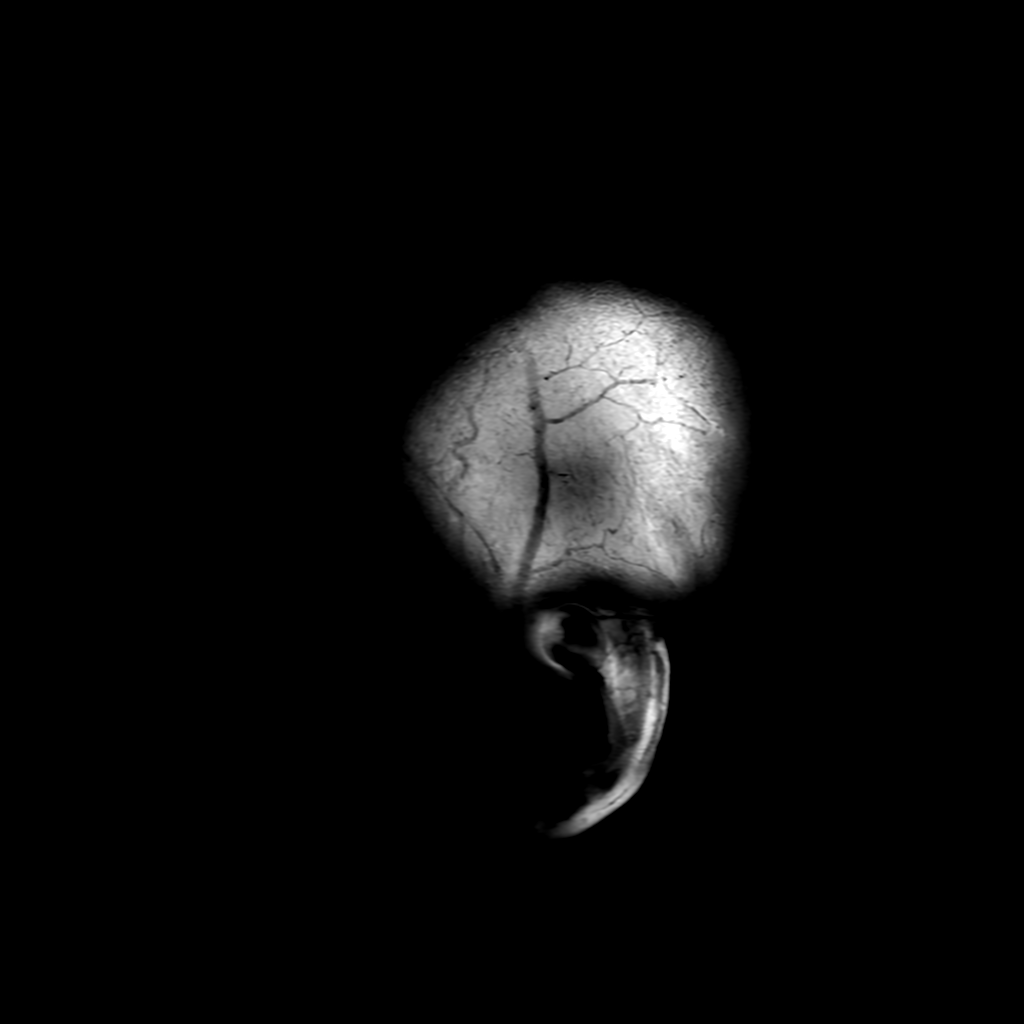
[im 24/24]
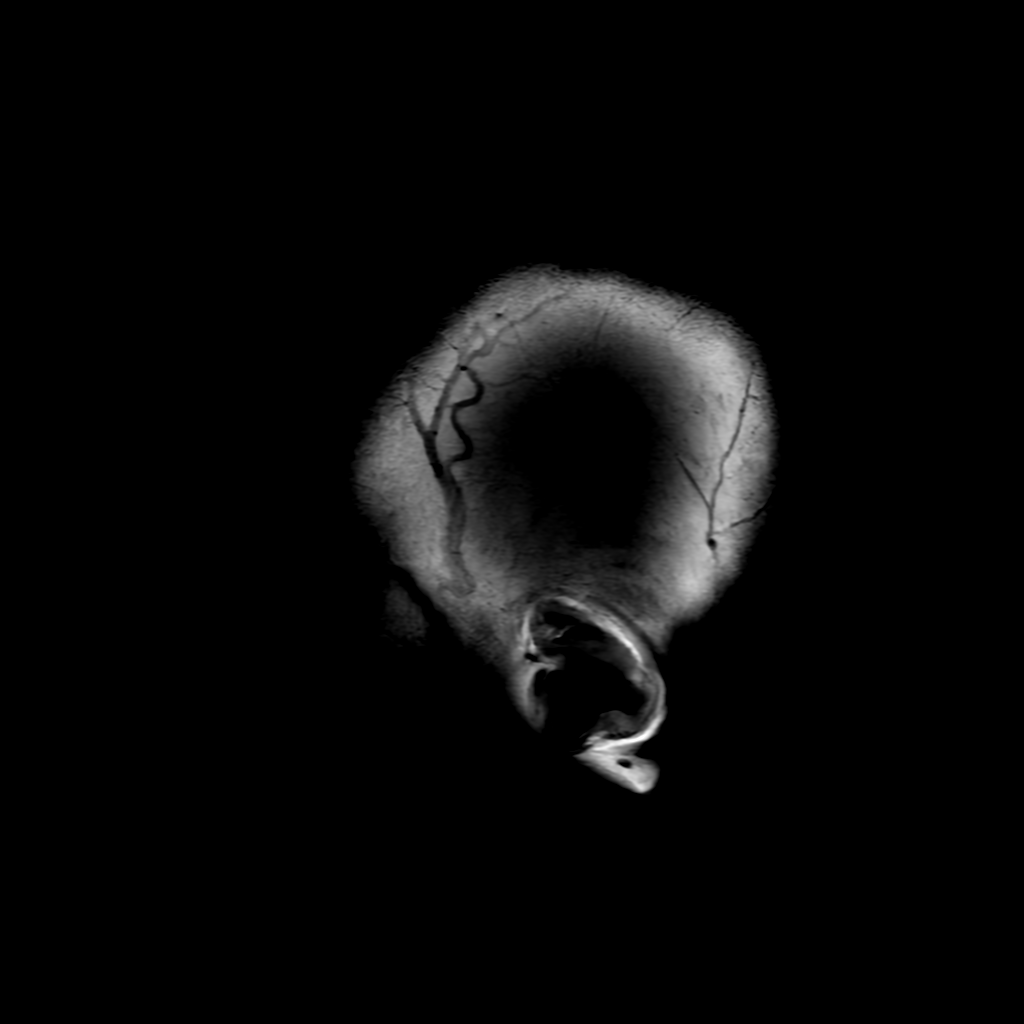

[Series 5: T2 · axial · 5.0mm · 0.23mm/px · 1 of 26 slices shown]
[im 1/26]
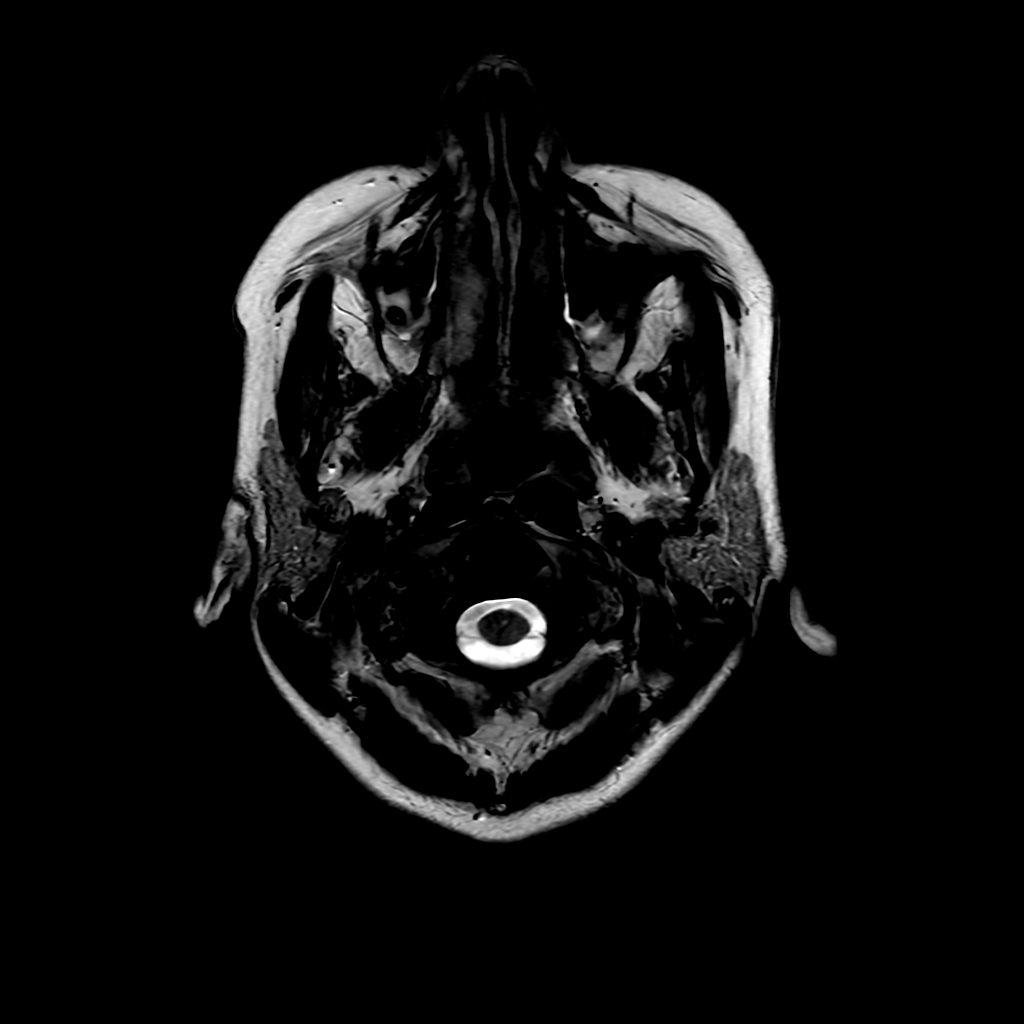

[Series 6: FLAIR · axial · 3.0mm · 0.45mm/px · z∈[-75,+69]mm · 2 of 26 slices shown (2 of 2)]
[im 1/26]
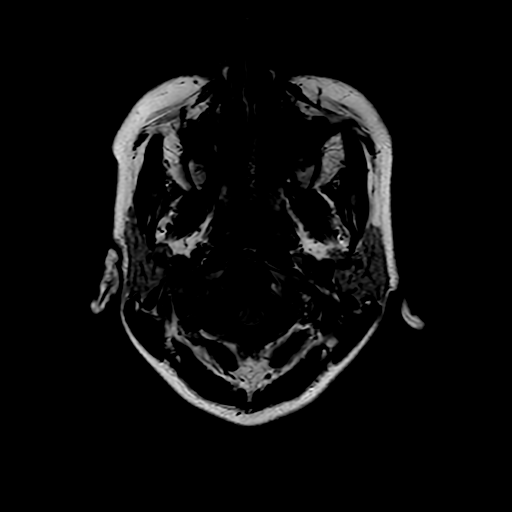
[im 26/26]
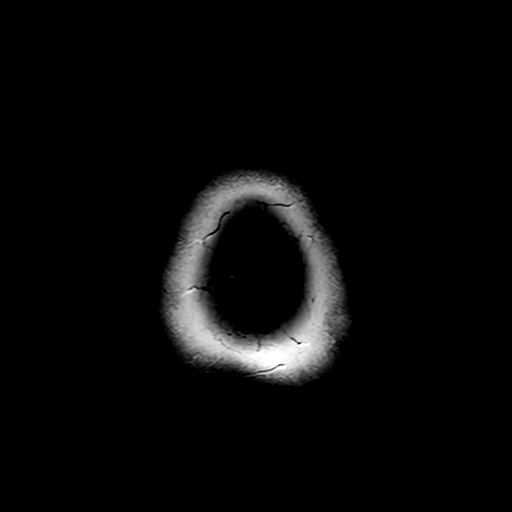

[Series 250: ADC · axial · 3.0mm · 0.94mm/px · z∈[-75,+71]mm · 4 of 52 slices shown (1 of 2)]
[im 1/52]
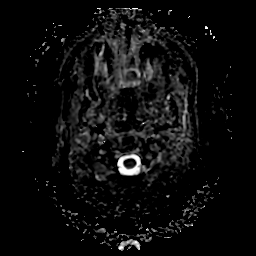
[im 18/52]
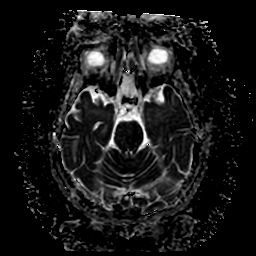
[im 35/52]
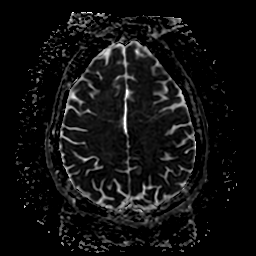
[im 52/52]
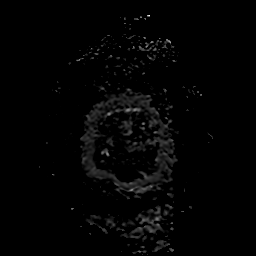

[Series 350: ADC · coronal · 4.0mm · 0.94mm/px · 3 of 36 slices shown (2 of 2)]
[im 1/36]
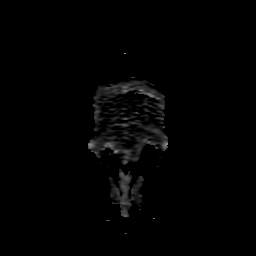
[im 18/36]
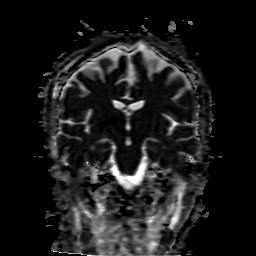
[im 36/36]
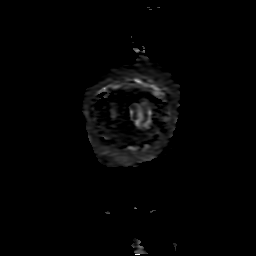

[25 of 48 positions shown; findings below may reference images not displayed]

FINDINGS: Brain: No acute infarct, mass effect or extra-axial collection. No
acute or chronic hemorrhage. Normal white matter signal, parenchymal
volume and CSF spaces. The midline structures are normal.

Vascular: Major flow voids are preserved.

Skull and upper cervical spine: Normal calvarium and skull base.
Visualized upper cervical spine and soft tissues are normal.

Sinuses/Orbits:No paranasal sinus fluid levels or advanced mucosal
thickening. No mastoid or middle ear effusion. Normal orbits.
IMPRESSION: Normal brain MRI.

## 2023-02-11 ENCOUNTER — Other Ambulatory Visit: Payer: Self-pay

## 2023-02-11 ENCOUNTER — Emergency Department (HOSPITAL_BASED_OUTPATIENT_CLINIC_OR_DEPARTMENT_OTHER)
Admission: EM | Admit: 2023-02-11 | Discharge: 2023-02-11 | Disposition: A | Discharge status: Home / Self Care | Payer: 59

## 2023-02-11 ENCOUNTER — Encounter (HOSPITAL_BASED_OUTPATIENT_CLINIC_OR_DEPARTMENT_OTHER): Payer: Self-pay

## 2023-02-11 ENCOUNTER — Emergency Department (HOSPITAL_BASED_OUTPATIENT_CLINIC_OR_DEPARTMENT_OTHER): Payer: 59

## 2023-02-11 DIAGNOSIS — R000 Tachycardia, unspecified: Secondary | ICD-10-CM | POA: Insufficient documentation

## 2023-02-11 DIAGNOSIS — L723 Sebaceous cyst: Secondary | ICD-10-CM | POA: Diagnosis not present

## 2023-02-11 DIAGNOSIS — R220 Localized swelling, mass and lump, head: Secondary | ICD-10-CM | POA: Diagnosis present

## 2023-02-11 LAB — CBC WITH DIFFERENTIAL/PLATELET
Abs Immature Granulocytes: 0.04 10*3/uL (ref 0.00–0.07)
Basophils Absolute: 0 10*3/uL (ref 0.0–0.1)
Basophils Relative: 1 %
Eosinophils Absolute: 0.1 10*3/uL (ref 0.0–0.5)
Eosinophils Relative: 3 %
HCT: 35.8 % — ABNORMAL LOW (ref 36.0–46.0)
Hemoglobin: 12.1 g/dL (ref 12.0–15.0)
Immature Granulocytes: 1 %
Lymphocytes Relative: 42 %
Lymphs Abs: 2.3 10*3/uL (ref 0.7–4.0)
MCH: 33.9 pg (ref 26.0–34.0)
MCHC: 33.8 g/dL (ref 30.0–36.0)
MCV: 100.3 fL — ABNORMAL HIGH (ref 80.0–100.0)
Monocytes Absolute: 0.6 10*3/uL (ref 0.1–1.0)
Monocytes Relative: 11 %
Neutro Abs: 2.2 10*3/uL (ref 1.7–7.7)
Neutrophils Relative %: 42 %
Platelets: 132 10*3/uL — ABNORMAL LOW (ref 150–400)
RBC: 3.57 MIL/uL — ABNORMAL LOW (ref 3.87–5.11)
RDW: 12.2 % (ref 11.5–15.5)
WBC: 5.2 10*3/uL (ref 4.0–10.5)
nRBC: 0 % (ref 0.0–0.2)

## 2023-02-11 LAB — COMPREHENSIVE METABOLIC PANEL
ALT: 29 U/L (ref 0–44)
AST: 74 U/L — ABNORMAL HIGH (ref 15–41)
Albumin: 4.2 g/dL (ref 3.5–5.0)
Alkaline Phosphatase: 105 U/L (ref 38–126)
Anion gap: 11 (ref 5–15)
BUN: <5 mg/dL — ABNORMAL LOW (ref 6–20)
CO2: 28 mmol/L (ref 22–32)
Calcium: 9.5 mg/dL (ref 8.9–10.3)
Chloride: 102 mmol/L (ref 98–111)
Creatinine, Ser: 0.37 mg/dL — ABNORMAL LOW (ref 0.44–1.00)
GFR, Estimated: >60 mL/min (ref >60)
Glucose, Bld: 105 mg/dL — ABNORMAL HIGH (ref 70–99)
Potassium: 3.7 mmol/L (ref 3.5–5.1)
Sodium: 141 mmol/L (ref 135–145)
Total Bilirubin: 0.5 mg/dL (ref 0.3–1.2)
Total Protein: 7.5 g/dL (ref 6.5–8.1)

## 2023-02-11 MED ORDER — IOHEXOL 300 MG/ML  SOLN
100.0000 mL | Freq: Once | INTRAMUSCULAR | Status: AC | PRN
  Administered 2023-02-11: 75 mL via INTRAVENOUS

## 2023-02-11 MED ORDER — IBUPROFEN 800 MG PO TABS
800.0000 mg | ORAL_TABLET | Freq: Once | ORAL | Status: AC
  Administered 2023-02-11: 800 mg via ORAL
  Filled 2023-02-11: qty 1

## 2023-02-11 MED ORDER — CLINDAMYCIN HCL 150 MG PO CAPS: 450.0000 mg | ORAL_CAPSULE | Freq: Three times a day (TID) | ORAL | 0 refills | Status: DC

## 2023-02-11 MED ORDER — ACETAMINOPHEN 325 MG PO TABS
650.0000 mg | ORAL_TABLET | Freq: Once | ORAL | Status: AC
  Administered 2023-02-11: 650 mg via ORAL
  Filled 2023-02-11: qty 2

## 2023-02-11 MED ORDER — CLINDAMYCIN HCL 150 MG PO CAPS
450.0000 mg | ORAL_CAPSULE | Freq: Once | ORAL | Status: AC
  Administered 2023-02-11: 450 mg via ORAL
  Filled 2023-02-11: qty 3

## 2023-02-11 NOTE — ED Notes (Signed)
Pt discharged home after verbalizing understanding of discharge instructions; nad noted. 

## 2023-02-11 NOTE — ED Provider Notes (Signed)
[place] EMERGENCY DEPARTMENT AT [facility] Provider Note   CSN: [id number] Arrival date & time: 02/11/23  1714     History  Chief Complaint  Patient presents with   Abscess    Erika Morris is a 37 y.o. female with history of a heart murmur, presents with concern for a bump to the right side of her cheek.  States it has been there for about 3 months, but it grew rapidly over the past couple days.  She denies any associated pain.  Denies any fever or chills.  Denies IV drug use, recent dental work.  HPI     Home Medications Prior to Admission medications   Medication Sig Start Date End Date Taking? Authorizing Provider  EPINEPHrine 0.3 mg/0.3 mL IJ SOAJ injection Inject 0.3 mg into the muscle as needed for anaphylaxis. 08/14/22   Bethann Berkshire, MD      Allergies    Patient has no known allergies.    Review of Systems   Review of Systems  Constitutional:  Negative for fever.    Physical Exam Updated Vital Signs BP 124/76   Pulse (!) 107   Temp 98.4 F (36.9 C)   Resp 16   SpO2 95%  Physical Exam Vitals and nursing note reviewed.  Constitutional:      General: She is not in acute distress.    Appearance: She is well-developed.  HENT:     Head: Normocephalic and atraumatic.      Comments: Approximately 3 cm diameter circular raised area over the right lower mandible.  No obvious areas of fluctuance, firm feeling.  Overlying skin with no changes.  No erythema or edema, no drainage from the site.  Poor oral dentition, no obvious dental abscess.  No peritonsillar abscess  No edema of the lips, tongue, posterior oropharynx.  Tolerating secretions.  Able to open mouth fully, no trismus Eyes:     Conjunctiva/sclera: Conjunctivae normal.  Neck:     Comments: Neck is soft and supple Cardiovascular:     Rate and Rhythm: Regular rhythm. Tachycardia present.     Heart sounds: No murmur heard. Pulmonary:     Effort: Pulmonary effort is normal. No  respiratory distress.     Breath sounds: Normal breath sounds.  Abdominal:     Palpations: Abdomen is soft.     Tenderness: There is no abdominal tenderness.  Musculoskeletal:        General: No swelling.     Cervical back: Neck supple.  Skin:    General: Skin is warm and dry.     Capillary Refill: Capillary refill takes less than 2 seconds.  Neurological:     Mental Status: She is alert.  Psychiatric:        Mood and Affect: Mood normal.     ED Results / Procedures / Treatments   Labs (all labs ordered are listed, but only abnormal results are displayed) Labs Reviewed  COMPREHENSIVE METABOLIC PANEL - Abnormal; Notable for the following components:      Result Value   Glucose, Bld 105 (*)    BUN <5 (*)    Creatinine, Ser 0.37 (*)    AST 74 (*)    All other components within normal limits  CBC WITH DIFFERENTIAL/PLATELET - Abnormal; Notable for the following components:   RBC 3.57 (*)    HCT 35.8 (*)    MCV 100.3 (*)    Platelets 132 (*)    All other components within normal limits  EKG None  Radiology No results found.  Procedures Procedures    Medications Ordered in ED Medications - No data to display  ED Course/ Medical Decision Making/ A&P                                 Medical Decision Making Amount and/or Complexity of Data Reviewed Labs: ordered. Radiology: ordered.   Differential diagnosis includes but is not limited to lipoma, mass, abscess, deep space infection  ED Course:  Patient overall well-appearing, no acute distress.  Afebrile.  But does have a tachycardia to 107.  She has a raised area over the right mandible, approximately 3 cm in diameter.  This area is firm feeling.  No obvious areas of fluctuance.  She does have poor dentition.  Denies any recent dental work, IV drug use, any recent infections.  Given her tachycardia and this bump, will obtain CBC to look for signs of infection.  CT maxillofacial to evaluate this bump.     Impression: Bump over right mandible  Disposition:  Care of this patient signed out to oncoming ED provider Delice Bison, PA-C to follow up on CT scan. Disposition and treatment plan pending imaging results and clinical judgment of oncoming ED team.   Lab Tests: I Ordered, and personally interpreted labs.  The pertinent results include:   CBC without any leukocytosis CMP with slight elevation in AST at 74  Imaging Studies ordered: I ordered imaging studies including CT Maxillofacial, imaging pending            Final Clinical Impression(s) / ED Diagnoses Final diagnoses:  None    Rx / DC Orders ED Discharge Orders     None         Arabella Merles, PA-C 02/11/23 1850    Durwin Glaze, MD 02/11/23 2245

## 2023-02-11 NOTE — Discharge Instructions (Addendum)
He will need to see ENT for further management and care of your suspected sebaceous cyst.  We are placing on clindamycin which is an antibiotic.  You will take this 3 times daily for 150 mg for the next 7 days.  Please call ENT in the morning and make an appointment to be seen.  Please return to the ED with any new symptoms such as drainage, fevers.

## 2023-02-11 NOTE — ED Triage Notes (Signed)
Pt presents w abscess on R cheek, "had it for months, but 3 days ago it started to blow up." Denies known fevers, additional symptoms

## 2023-02-11 NOTE — ED Provider Notes (Signed)
  Physical Exam  BP 102/74 (BP Location: Right Arm)   Pulse 90   Temp 98.5 F (36.9 C) (Oral)   Resp 16   SpO2 95%   Physical Exam Vitals and nursing note reviewed.  HENT:     Head:     Comments: Approximately 3 cm diameter circular raised area over the right lower mandible.  No obvious areas of fluctuance, firm feeling.  Overlying skin with no changes.  No erythema or edema, no drainage from the site.    Procedures  Procedures  ED Course / MDM    Medical Decision Making Amount and/or Complexity of Data Reviewed Labs: ordered. Radiology: ordered.  Risk OTC drugs. Prescription drug management.   Patient signed out to me at shift change pending CT maxillofacial.  In short, 37 year old female presents for swelling/possible abscess to the lower right side of her face.  Reports it has been there for about 3 months but grew rapidly over the last couple days.  Denies any associated pain, fevers, chills, drainage.   CT maxillofacial shows 2.2 x 1.8 x 2.5 centimeter rounded hypodense lesion involving the subcutaneous fat of the right face of the right masticator space, suspicious for possible abscess.  Suspicious for sebaceous cyst with superimposed infection.  No obvious fluctuance, abscess, erythema noted.  Do not feel as if this is amenable to incision and drainage here in the department.  Will reach out to general surgery for possible recommendations.  Discussed patient case with Dr. Freida Busman.  She recommends referring patient to ENT for further management care.  Will place the patient on clindamycin 450mg  three times daily for the next 7 days to cover any infection. She will follow-up with ENT.         Al Decant, PA-C 02/11/23 2130    Durwin Glaze, MD 02/11/23 2250

## 2023-10-13 ENCOUNTER — Encounter (HOSPITAL_BASED_OUTPATIENT_CLINIC_OR_DEPARTMENT_OTHER): Payer: Self-pay | Admitting: Emergency Medicine

## 2023-10-13 ENCOUNTER — Other Ambulatory Visit: Payer: Self-pay

## 2023-10-13 ENCOUNTER — Emergency Department (HOSPITAL_BASED_OUTPATIENT_CLINIC_OR_DEPARTMENT_OTHER): Payer: Self-pay | Admitting: Radiology

## 2023-10-13 ENCOUNTER — Emergency Department (HOSPITAL_BASED_OUTPATIENT_CLINIC_OR_DEPARTMENT_OTHER)
Admission: EM | Admit: 2023-10-13 | Discharge: 2023-10-13 | Disposition: A | Discharge status: Home / Self Care | Payer: Self-pay | Attending: Emergency Medicine | Admitting: Emergency Medicine

## 2023-10-13 DIAGNOSIS — S5012XA Contusion of left forearm, initial encounter: Secondary | ICD-10-CM | POA: Insufficient documentation

## 2023-10-13 DIAGNOSIS — Y9301 Activity, walking, marching and hiking: Secondary | ICD-10-CM | POA: Insufficient documentation

## 2023-10-13 DIAGNOSIS — W010XXA Fall on same level from slipping, tripping and stumbling without subsequent striking against object, initial encounter: Secondary | ICD-10-CM | POA: Insufficient documentation

## 2023-10-13 NOTE — ED Notes (Signed)
 Splint placed by NT. CNS intact prior to and following application. Pt instructed on use and given education regarding splint.

## 2023-10-13 NOTE — ED Provider Notes (Signed)
 [place] EMERGENCY DEPARTMENT AT [facility] Provider Note   CSN: [id number] Arrival date & time: 10/13/23  1317     Patient presents with: No chief complaint on file.   Erika Morris is a 38 y.o. female.   Patient to ED with left forearm and wrist pain after fall last night. She tripped while walking backward wearing flip-flops, fell and landed on the left arm. No other injury. No wound.   The history is provided by the patient. No language interpreter was used.       Prior to Admission medications   Medication Sig Start Date End Date Taking? Authorizing Provider  clindamycin  (CLEOCIN ) 150 MG capsule Take 3 capsules (450 mg total) by mouth 3 (three) times daily. 02/11/23   Ruthell Lonni FALCON, PA-C  EPINEPHrine  0.3 mg/0.3 mL IJ SOAJ injection Inject 0.3 mg into the muscle as needed for anaphylaxis. 08/14/22   Suzette Pac, MD    Allergies: Patient has no known allergies.    Review of Systems  Updated Vital Signs BP 117/73   Pulse 94   Temp 98.4 F (36.9 C) (Oral)   Resp 18   Wt 59 kg   SpO2 99%   BMI 19.77 kg/m   Physical Exam Vitals and nursing note reviewed.  Constitutional:      General: She is not in acute distress.    Appearance: She is well-developed. She is not ill-appearing.  Pulmonary:     Effort: Pulmonary effort is normal.  Musculoskeletal:        General: Normal range of motion.     Cervical back: Normal range of motion.     Comments: Left forearm tender from proximal dorsum to distal dorsum. No bony deformity. Moderate swelling. Soft muscle compartments. Distal pulses intact. Moves all digits of the hand. No elbow tenderness.   Skin:    General: Skin is warm and dry.  Neurological:     Mental Status: She is alert and oriented to person, place, and time.     (all labs ordered are listed, but only abnormal results are displayed) Labs Reviewed - No data to display  EKG: None  Radiology: DG Wrist Complete Left Result Date:  10/13/2023 CLINICAL DATA:  Fall.  Wrist pain EXAM: LEFT WRIST - COMPLETE 3+ VIEW COMPARISON:  None Available. FINDINGS: No distal radius or ulnar fracture. Radiocarpal joint is intact. No carpal fracture. No soft tissue abnormality. IMPRESSION: No fracture or dislocation. Electronically Signed   By: Jackquline Boxer M.D.   On: 10/13/2023 14:13   DG Forearm Left Result Date: 10/13/2023 CLINICAL DATA:  DWB-DGfall EXAM: LEFT FOREARM - 2 VIEW COMPARISON:  None Available. FINDINGS: No fracture of the radius or ulna. The elbow joint and the wrist joint appear normal on two views. IMPRESSION: No fracture or dislocation. Electronically Signed   By: Jackquline Boxer M.D.   On: 10/13/2023 14:10     Procedures   Medications Ordered in the ED - No data to display  Clinical Course as of 10/13/23 1459  Sat Oct 13, 2023  1456 Here with left forearm injury after fall last night. No wound. Xrays of forearm and wrist are negative for bony injury. No sign of compartment syndrome. No vascular compromise. Velcro splint provided for comfort. Recommended ibuprofen  every 6 hours. She declined need for OOW note.  [SU]    Clinical Course User Index [SU] Odell Balls, PA-C  Medical Decision Making Amount and/or Complexity of Data Reviewed Radiology: ordered.        Final diagnoses:  Contusion of left forearm, initial encounter    ED Discharge Orders     None          Odell Balls, PA-C 10/13/23 1459    Randol Simmonds, MD 10/14/23 [phone]

## 2023-10-13 NOTE — Discharge Instructions (Signed)
 As we discussed, take 600 mg ibuprofen , which is 3 of the over-the-counter strength tablets, every 6 hours for pain and inflammation. Wear the splint when active and remove it when at rest to allow some movement in the arm.   Return to the ED with any new or worsening symptoms.

## 2023-10-13 NOTE — ED Triage Notes (Signed)
 Fell last night landed on left wrist. Left wrist painful/ swollen.

## 2023-10-13 NOTE — ED Notes (Signed)
 Pt d/c instructions, medications, and follow-up care reviewed with pt. Pt verbalized understanding and had no further questions at time of d/c. Pt CA&Ox4, ambulatory, and in NAD at time of d/c

## 2023-11-19 ENCOUNTER — Emergency Department (HOSPITAL_BASED_OUTPATIENT_CLINIC_OR_DEPARTMENT_OTHER)
Admission: EM | Admit: 2023-11-19 | Discharge: 2023-11-19 | Disposition: A | Discharge status: Home / Self Care | Payer: Self-pay | Attending: Emergency Medicine | Admitting: Emergency Medicine

## 2023-11-19 ENCOUNTER — Other Ambulatory Visit: Payer: Self-pay

## 2023-11-19 DIAGNOSIS — R000 Tachycardia, unspecified: Secondary | ICD-10-CM | POA: Insufficient documentation

## 2023-11-19 DIAGNOSIS — B029 Zoster without complications: Secondary | ICD-10-CM | POA: Insufficient documentation

## 2023-11-19 MED ORDER — GABAPENTIN 300 MG PO CAPS
300.0000 mg | ORAL_CAPSULE | Freq: Once | ORAL | Status: AC
  Administered 2023-11-19 (×2): 300 mg via ORAL
  Filled 2023-11-19: qty 1

## 2023-11-19 MED ORDER — VALACYCLOVIR HCL 1 G PO TABS: 1000.0000 mg | ORAL_TABLET | Freq: Three times a day (TID) | ORAL | 0 refills | Status: AC

## 2023-11-19 MED ORDER — GABAPENTIN 300 MG PO CAPS: 300.0000 mg | ORAL_CAPSULE | Freq: Three times a day (TID) | ORAL | 0 refills | Status: DC | PRN

## 2023-11-19 MED ORDER — VALACYCLOVIR HCL 500 MG PO TABS
1000.0000 mg | ORAL_TABLET | Freq: Once | ORAL | Status: AC
  Administered 2023-11-19 (×2): 1000 mg via ORAL
  Filled 2023-11-19: qty 2

## 2023-11-19 NOTE — ED Triage Notes (Signed)
 Patient reports painful itchy rash to right trunk and back. Concerned for shingles

## 2023-11-19 NOTE — ED Provider Notes (Signed)
 [place] EMERGENCY DEPARTMENT AT [facility] Provider Note   CSN: [id number] Arrival date & time: 11/19/23  1556     Patient presents with: Rash   Erika Morris is a 38 y.o. female presents for a painful and itchy rash to the right upper back and right upper chest wall.  States this started 5 days ago.  Denies any new soaps, lotions, detergents medications, or foods.  Denies any possible exposure to new plants.  Denies any systemic symptoms such as fever, chills, cough, sore throat.     Rash      Prior to Admission medications   Medication Sig Start Date End Date Taking? Authorizing Provider  gabapentin  (NEURONTIN ) 300 MG capsule Take 1 capsule (300 mg total) by mouth every 8 (eight) hours as needed (For pain and itching). 11/19/23  Yes Veta Palma, PA-C  valACYclovir  (VALTREX ) 1000 MG tablet Take 1 tablet (1,000 mg total) by mouth 3 (three) times daily for 10 days. 11/19/23 11/29/23 Yes Veta Palma, PA-C  clindamycin  (CLEOCIN ) 150 MG capsule Take 3 capsules (450 mg total) by mouth 3 (three) times daily. 02/11/23   Ruthell Lonni FALCON, PA-C  EPINEPHrine  0.3 mg/0.3 mL IJ SOAJ injection Inject 0.3 mg into the muscle as needed for anaphylaxis. 08/14/22   Suzette Pac, MD    Allergies: Patient has no known allergies.    Review of Systems  Skin:  Positive for rash.       Updated Vital Signs BP 123/88 (BP Location: Right Arm)   Pulse (!) 103   Temp 98.9 F (37.2 C) (Oral)   Resp 16   SpO2 99%   Physical Exam Vitals and nursing note reviewed.  Constitutional:      Appearance: Normal appearance.  HENT:     Head: Atraumatic.  Cardiovascular:     Rate and Rhythm: Regular rhythm. Tachycardia present.  Pulmonary:     Effort: Pulmonary effort is normal.  Skin:    Comments: Raised erythematous nodules and papules with some vesicles overlying the right upper back and the right upper chest wall.  No skin peeling or blistering.  Rash not located elsewhere of  the back, abdomen, arms, legs, mouth, palms or soles.   Neurological:     General: No focal deficit present.     Mental Status: She is alert.  Psychiatric:        Mood and Affect: Mood normal.        Behavior: Behavior normal.     (all labs ordered are listed, but only abnormal results are displayed) Labs Reviewed - No data to display  EKG: None  Radiology: No results found.   Procedures   Medications Ordered in the ED  valACYclovir  (VALTREX ) tablet 1,000 mg (has no administration in time range)  gabapentin  (NEURONTIN ) capsule 300 mg (has no administration in time range)                                    Medical Decision Making    Differential diagnosis includes but is not limited to herpes zoster, contact dermatitis, SJS, DRESS, cellulitis  ED Course:  Upon initial evaluation, patient is well-appearing, no acute distress.  Does have mild tachycardia to 103 upon my evaluation, does appear very anxious and reports that the pain associated with her rash. No surrounding erythema, no purulent drainage, no fevers, low concern for cellulitis or infection.  On exam, has erythematous raised papules on  the right upper back and right upper chest wall.  No rash elsewhere.  No involvement of palms or soles, or mucosal surfaces.  No skin peeling or blistering.  No concern for emergent etiologies such as SJS.  She has not had any systemic symptoms to suggest viral exanthem or dress.  No new soaps, lotions, medications, foods, and given localized distribution, lower concern for a contact dermatitis or allergic reaction.   Given the painful area of this rash as well as localized dermatomal distribution in C4, suspect shingles.  This was discussed with my attending Dr. Bari who also reviewed images and agrees with plan of care to treat with valacyclovir .  Patient stable and appropriate for discharge home   Medications Given: Valacyclovir  Gabapentin   Impression: Herpes  zoster  Disposition:  The patient was discharged home with instructions to take course of valacyclovir  as prescribed.  May take prescribed gabapentin  as needed for pain.  Follow-up with PCP for recheck of symptoms within the next week. Return precautions given.    This chart was dictated using voice recognition software, Dragon. Despite the best efforts of this provider to proofread and correct errors, errors may still occur which can change documentation meaning.       Final diagnoses:  Herpes zoster without complication    ED Discharge Orders          Ordered    valACYclovir  (VALTREX ) 1000 MG tablet  3 times daily        11/19/23 2037    gabapentin  (NEURONTIN ) 300 MG capsule  Every 8 hours PRN        11/19/23 2037               Veta Palma, PA-C 11/19/23 2039    Horton, Kristie M, DO 11/21/23 2224

## 2023-11-19 NOTE — Discharge Instructions (Addendum)
 You are being treated for shingles.  You have been prescribed a medication called valacyclovir .  Please take this 3 times daily for the next 10 days.  You were given your first dose here today.  Take your next dose tomorrow morning.  You had been prescribed a medication called gabapentin  to help with the pain.  You may take this up to 3 times daily for pain.  Do not pick at the rash or pop any blisters that form as this can spread the virus to other people.  Please follow-up with your PCP for recheck of symptoms in 1 week.  Please return to the ER for worsening pain, fevers, severe skin blistering, any rash in your mouth, any other new or concerning symptoms

## 2023-11-24 ENCOUNTER — Encounter (HOSPITAL_BASED_OUTPATIENT_CLINIC_OR_DEPARTMENT_OTHER): Payer: Self-pay

## 2023-11-24 ENCOUNTER — Emergency Department (HOSPITAL_BASED_OUTPATIENT_CLINIC_OR_DEPARTMENT_OTHER)
Admission: EM | Admit: 2023-11-24 | Discharge: 2023-11-24 | Disposition: A | Discharge status: Home / Self Care | Payer: Self-pay

## 2023-11-24 ENCOUNTER — Other Ambulatory Visit: Payer: Self-pay

## 2023-11-24 DIAGNOSIS — L723 Sebaceous cyst: Secondary | ICD-10-CM | POA: Insufficient documentation

## 2023-11-24 DIAGNOSIS — Z720 Tobacco use: Secondary | ICD-10-CM | POA: Insufficient documentation

## 2023-11-24 MED ORDER — LIDOCAINE-EPINEPHRINE (PF) 2 %-1:200000 IJ SOLN
10.0000 mL | Freq: Once | INTRAMUSCULAR | Status: AC
  Administered 2023-11-24: 10 mL
  Filled 2023-11-24: qty 20

## 2023-11-24 NOTE — Discharge Instructions (Addendum)
 As discussed, your sebaceous cyst was drained in the emergency department.  Recommend washing area gently with warm soapy water.  Avoid submersion in bodies of water.  Area should heal within the next several days.  Return if you develop signs of infection.

## 2023-11-24 NOTE — ED Provider Notes (Signed)
 [place] EMERGENCY DEPARTMENT AT [facility] Provider Note   CSN: [id number] Arrival date & time: 11/24/23  2044     Patient presents with: Cyst   Romesha Morris is a 38 y.o. female.   HPI   38 year old female presents emergency department with complaints of cyst.  States that areas been present for the last 3 months and is in between her buttock.  Denies any fevers, drainage, abdominal pain, nausea, vomiting.  Denies any rectal pain with bowel movements.  Has not had 1 in this area before.  Presents emergency department for drainage.  Past medical history significant for heart murmur.  Prior to Admission medications   Medication Sig Start Date End Date Taking? Authorizing Provider  clindamycin  (CLEOCIN ) 150 MG capsule Take 3 capsules (450 mg total) by mouth 3 (three) times daily. 02/11/23   Ruthell Lonni FALCON, PA-C  EPINEPHrine  0.3 mg/0.3 mL IJ SOAJ injection Inject 0.3 mg into the muscle as needed for anaphylaxis. 08/14/22   Suzette Pac, MD  gabapentin  (NEURONTIN ) 300 MG capsule Take 1 capsule (300 mg total) by mouth every 8 (eight) hours as needed (For pain and itching). 11/19/23   Franaszek, Amanda, PA-C  valACYclovir  (VALTREX ) 1000 MG tablet Take 1 tablet (1,000 mg total) by mouth 3 (three) times daily for 10 days. 11/19/23 11/29/23  Veta Palma, PA-C    Allergies: Patient has no known allergies.    Review of Systems  All other systems reviewed and are negative.   Updated Vital Signs BP 131/80 (BP Location: Right Arm)   Pulse 80   Temp 98 F (36.7 C) (Oral)   Resp 18   SpO2 98%   Physical Exam Vitals and nursing note reviewed. Exam conducted with a chaperone present.  Constitutional:      General: She is not in acute distress.    Appearance: She is well-developed.  HENT:     Head: Normocephalic and atraumatic.  Eyes:     Conjunctiva/sclera: Conjunctivae normal.  Cardiovascular:     Rate and Rhythm: Normal rate and regular rhythm.     Heart  sounds: No murmur heard. Pulmonary:     Effort: Pulmonary effort is normal. No respiratory distress.     Breath sounds: Normal breath sounds.  Abdominal:     Palpations: Abdomen is soft.     Tenderness: There is no abdominal tenderness.  Genitourinary:     Comments: 2.7 cm in the area of palpable fluctuance as above.  No complication with rectum.  Female nursing staff at bedside during exam. Musculoskeletal:        General: No swelling.     Cervical back: Neck supple.  Skin:    General: Skin is warm and dry.     Capillary Refill: Capillary refill takes less than 2 seconds.  Neurological:     Mental Status: She is alert.  Psychiatric:        Mood and Affect: Mood normal.     (all labs ordered are listed, but only abnormal results are displayed) Labs Reviewed - No data to display  EKG: None  Radiology: No results found.   .Incision and Drainage  Date/Time: 11/24/2023 11:31 PM  Performed by: Silver Wonda LABOR, PA Authorized by: Silver Wonda LABOR, PA   Consent:    Consent obtained:  Verbal   Consent given by:  Patient   Risks, benefits, and alternatives were discussed: yes     Risks discussed:  Bleeding, damage to other organs, infection, incomplete drainage and pain  Alternatives discussed:  Delayed treatment, no treatment and alternative treatment Universal protocol:    Procedure explained and questions answered to patient or proxy's satisfaction: yes     Relevant documents present and verified: yes     Patient identity confirmed:  Verbally with patient Location:    Type:  Cyst   Size:  2.7   Location:  Anogenital   Anogenital location:  Gluteal cleft Pre-procedure details:    Skin preparation:  Povidone-iodine Sedation:    Sedation type:  None Anesthesia:    Anesthesia method:  Local infiltration   Local anesthetic:  Lidocaine  2% WITH epi Procedure type:    Complexity:  Simple Procedure details:    Ultrasound guidance: no     Needle aspiration: no      Incision types:  Stab incision   Wound management:  Probed and deloculated and irrigated with saline   Drainage:  Bloody (white, clumpy)   Drainage amount:  Copious   Wound treatment:  Wound left open   Packing materials:  None Post-procedure details:    Procedure completion:  Tolerated well, no immediate complications    Medications Ordered in the ED  lidocaine -EPINEPHrine  (XYLOCAINE  W/EPI) 2 %-1:200000 (PF) injection 10 mL (10 mLs Infiltration Given 11/24/23 2242)                                    Medical Decision Making Risk Prescription drug management.   This patient presents to the ED for concern of abscess, this involves an extensive number of treatment options, and is a complaint that carries with it a high risk of complications and morbidity.  The differential diagnosis includes abscess, cellulitis, necrotizing infection, cyst, other   Co morbidities that complicate the patient evaluation  See HPI   Additional history obtained:  Additional history obtained from EMR External records from outside source obtained and reviewed including hospital records   Lab Tests:  N/a   Imaging Studies ordered:  N/a   Cardiac Monitoring: / EKG:  N/a   Consultations Obtained:  N/a   Problem List / ED Course / Critical interventions / Medication management  The patient states I ordered medication including lidocaine  with epinephrine    Reevaluation of the patient after these medicines showed that the patient improved I have reviewed the patients home medicines and have made adjustments as needed   Social Determinants of Health:  Tobacco use.  Denies illicit drug use.   Test / Admission - Considered:  Sebaceous cyst Vitals signs  within normal range and stable throughout visit.  38 year old female presents emergency department with complaints of cyst.  States that areas been present for the last 3 months and is in between her buttock.  Denies any fevers,  drainage, abdominal pain, nausea, vomiting.  Denies any rectal pain with bowel movements.  Has not had 1 in this area before.  Presents emergency department for drainage. On exam, area of palpable fluctuance just right of midline buttock.  Area anesthetized, cleansed and drained in manner as above with copious amounts of thick white discharge present.  Area consistent with cyst.  Most of sac was removed in the ED.  Will leave wound open to continue to drain as the incision only measures around 1.2 cm in length will recommend wound care at home follow-up with primary care for reassessment.  Treatment plan discussed with patient and she acknowledged understanding was agreeable to said plan.  Patient  well-appearing, afebrile in no acute distress upon discharge. Worrisome signs and symptoms were discussed with the patient, and the patient acknowledged understanding to return to the ED if noticed. Patient was stable upon discharge.       Final diagnoses:  None    ED Discharge Orders     None          Silver Wonda LABOR, [place] 11/24/23 2332    Simon Lavonia SAILOR, MD 11/25/23 ALVIE

## 2023-11-24 NOTE — ED Triage Notes (Signed)
 Pt reports she is here today due to cyst on right buttock. Pt reports x3 months. Pt denies any fevers.

## 2024-01-28 ENCOUNTER — Inpatient Hospital Stay (HOSPITAL_COMMUNITY)
Admission: EM | Admit: 2024-01-28 | Discharge: 2024-01-31 | DRG: 378 | Disposition: A | Discharge status: Home / Self Care | Payer: MEDICAID | Attending: Internal Medicine | Admitting: Internal Medicine

## 2024-01-28 ENCOUNTER — Encounter (HOSPITAL_COMMUNITY): Payer: Self-pay

## 2024-01-28 ENCOUNTER — Emergency Department (HOSPITAL_COMMUNITY): Payer: Self-pay

## 2024-01-28 ENCOUNTER — Other Ambulatory Visit: Payer: Self-pay

## 2024-01-28 DIAGNOSIS — K766 Portal hypertension: Secondary | ICD-10-CM | POA: Diagnosis present

## 2024-01-28 DIAGNOSIS — D689 Coagulation defect, unspecified: Secondary | ICD-10-CM | POA: Diagnosis present

## 2024-01-28 DIAGNOSIS — F17210 Nicotine dependence, cigarettes, uncomplicated: Secondary | ICD-10-CM | POA: Diagnosis present

## 2024-01-28 DIAGNOSIS — K8000 Calculus of gallbladder with acute cholecystitis without obstruction: Secondary | ICD-10-CM | POA: Diagnosis present

## 2024-01-28 DIAGNOSIS — R8290 Unspecified abnormal findings in urine: Secondary | ICD-10-CM | POA: Diagnosis present

## 2024-01-28 DIAGNOSIS — K921 Melena: Secondary | ICD-10-CM | POA: Diagnosis present

## 2024-01-28 DIAGNOSIS — Z91018 Allergy to other foods: Secondary | ICD-10-CM | POA: Diagnosis not present

## 2024-01-28 DIAGNOSIS — F1020 Alcohol dependence, uncomplicated: Secondary | ICD-10-CM | POA: Diagnosis present

## 2024-01-28 DIAGNOSIS — R1084 Generalized abdominal pain: Secondary | ICD-10-CM

## 2024-01-28 DIAGNOSIS — D539 Nutritional anemia, unspecified: Secondary | ICD-10-CM | POA: Diagnosis present

## 2024-01-28 DIAGNOSIS — K449 Diaphragmatic hernia without obstruction or gangrene: Secondary | ICD-10-CM | POA: Diagnosis present

## 2024-01-28 DIAGNOSIS — K7011 Alcoholic hepatitis with ascites: Secondary | ICD-10-CM | POA: Diagnosis present

## 2024-01-28 DIAGNOSIS — R17 Unspecified jaundice: Secondary | ICD-10-CM | POA: Diagnosis present

## 2024-01-28 DIAGNOSIS — Z79899 Other long term (current) drug therapy: Secondary | ICD-10-CM

## 2024-01-28 DIAGNOSIS — E162 Hypoglycemia, unspecified: Secondary | ICD-10-CM | POA: Diagnosis present

## 2024-01-28 DIAGNOSIS — K3189 Other diseases of stomach and duodenum: Secondary | ICD-10-CM | POA: Diagnosis present

## 2024-01-28 DIAGNOSIS — R933 Abnormal findings on diagnostic imaging of other parts of digestive tract: Secondary | ICD-10-CM

## 2024-01-28 DIAGNOSIS — E876 Hypokalemia: Secondary | ICD-10-CM | POA: Diagnosis present

## 2024-01-28 DIAGNOSIS — K760 Fatty (change of) liver, not elsewhere classified: Secondary | ICD-10-CM | POA: Diagnosis present

## 2024-01-28 DIAGNOSIS — E871 Hypo-osmolality and hyponatremia: Secondary | ICD-10-CM | POA: Diagnosis present

## 2024-01-28 DIAGNOSIS — R7989 Other specified abnormal findings of blood chemistry: Secondary | ICD-10-CM

## 2024-01-28 DIAGNOSIS — K8020 Calculus of gallbladder without cholecystitis without obstruction: Secondary | ICD-10-CM

## 2024-01-28 DIAGNOSIS — D62 Acute posthemorrhagic anemia: Secondary | ICD-10-CM | POA: Diagnosis present

## 2024-01-28 DIAGNOSIS — K5900 Constipation, unspecified: Secondary | ICD-10-CM | POA: Diagnosis present

## 2024-01-28 DIAGNOSIS — I8510 Secondary esophageal varices without bleeding: Secondary | ICD-10-CM | POA: Diagnosis present

## 2024-01-28 DIAGNOSIS — K7031 Alcoholic cirrhosis of liver with ascites: Secondary | ICD-10-CM | POA: Diagnosis present

## 2024-01-28 DIAGNOSIS — Z91048 Other nonmedicinal substance allergy status: Secondary | ICD-10-CM | POA: Diagnosis not present

## 2024-01-28 DIAGNOSIS — F1090 Alcohol use, unspecified, uncomplicated: Secondary | ICD-10-CM

## 2024-01-28 DIAGNOSIS — E538 Deficiency of other specified B group vitamins: Secondary | ICD-10-CM | POA: Diagnosis present

## 2024-01-28 DIAGNOSIS — D649 Anemia, unspecified: Secondary | ICD-10-CM

## 2024-01-28 DIAGNOSIS — K922 Gastrointestinal hemorrhage, unspecified: Secondary | ICD-10-CM

## 2024-01-28 LAB — ABO/RH: ABO/RH(D): A NEG

## 2024-01-28 LAB — COMPREHENSIVE METABOLIC PANEL WITH GFR
ALT: 27 U/L (ref 0–44)
AST: 154 U/L — ABNORMAL HIGH (ref 15–41)
Albumin: 1.8 g/dL — ABNORMAL LOW (ref 3.5–5.0)
Alkaline Phosphatase: 182 U/L — ABNORMAL HIGH (ref 38–126)
Anion gap: 15 (ref 5–15)
BUN: <5 mg/dL — ABNORMAL LOW (ref 6–20)
CO2: 21 mmol/L — ABNORMAL LOW (ref 22–32)
Calcium: 7.9 mg/dL — ABNORMAL LOW (ref 8.9–10.3)
Chloride: 93 mmol/L — ABNORMAL LOW (ref 98–111)
Creatinine, Ser: 0.69 mg/dL (ref 0.44–1.00)
GFR, Estimated: >60 mL/min (ref >60)
Glucose, Bld: 85 mg/dL (ref 70–99)
Potassium: 3.1 mmol/L — ABNORMAL LOW (ref 3.5–5.1)
Sodium: 129 mmol/L — ABNORMAL LOW (ref 135–145)
Total Bilirubin: 11.5 mg/dL — ABNORMAL HIGH (ref 0.0–1.2)
Total Protein: 7.1 g/dL (ref 6.5–8.1)

## 2024-01-28 LAB — URINALYSIS, ROUTINE W REFLEX MICROSCOPIC
Bilirubin Urine: NEGATIVE
Glucose, UA: NEGATIVE mg/dL
Hgb urine dipstick: NEGATIVE
Ketones, ur: NEGATIVE mg/dL
Leukocytes,Ua: NEGATIVE
Nitrite: POSITIVE — AB
Protein, ur: NEGATIVE mg/dL
Specific Gravity, Urine: 1.003 — ABNORMAL LOW (ref 1.005–1.030)
pH: 6 (ref 5.0–8.0)

## 2024-01-28 LAB — CBC WITH DIFFERENTIAL/PLATELET
Abs Immature Granulocytes: 0.07 K/uL (ref 0.00–0.07)
Basophils Absolute: 0.1 K/uL (ref 0.0–0.1)
Basophils Relative: 0 %
Eosinophils Absolute: 0.1 K/uL (ref 0.0–0.5)
Eosinophils Relative: 1 %
HCT: 23.3 % — ABNORMAL LOW (ref 36.0–46.0)
Hemoglobin: 7.9 g/dL — ABNORMAL LOW (ref 12.0–15.0)
Immature Granulocytes: 1 %
Lymphocytes Relative: 17 %
Lymphs Abs: 2 K/uL (ref 0.7–4.0)
MCH: 36.6 pg — ABNORMAL HIGH (ref 26.0–34.0)
MCHC: 33.9 g/dL (ref 30.0–36.0)
MCV: 107.9 fL — ABNORMAL HIGH (ref 80.0–100.0)
Monocytes Absolute: 1.1 K/uL — ABNORMAL HIGH (ref 0.1–1.0)
Monocytes Relative: 9 %
Neutro Abs: 8.4 K/uL — ABNORMAL HIGH (ref 1.7–7.7)
Neutrophils Relative %: 72 %
Platelets: 244 K/uL (ref 150–400)
RBC: 2.16 MIL/uL — ABNORMAL LOW (ref 3.87–5.11)
RDW: 15 % (ref 11.5–15.5)
WBC: 11.7 K/uL — ABNORMAL HIGH (ref 4.0–10.5)
nRBC: 0 % (ref 0.0–0.2)

## 2024-01-28 LAB — PROTIME-INR
INR: 1.6 — ABNORMAL HIGH (ref 0.8–1.2)
Prothrombin Time: 19.7 s — ABNORMAL HIGH (ref 11.4–15.2)

## 2024-01-28 LAB — FOLATE: Folate: 4.8 ng/mL — ABNORMAL LOW (ref >5.9)

## 2024-01-28 LAB — HCG, SERUM, QUALITATIVE: Preg, Serum: NEGATIVE

## 2024-01-28 LAB — LIPASE, BLOOD: Lipase: 27 U/L (ref 11–51)

## 2024-01-28 LAB — AMMONIA: Ammonia: 49 umol/L — ABNORMAL HIGH (ref 9–35)

## 2024-01-28 LAB — ETHANOL: Alcohol, Ethyl (B): 94 mg/dL — ABNORMAL HIGH (ref <15)

## 2024-01-28 MED ORDER — ALBUTEROL SULFATE (2.5 MG/3ML) 0.083% IN NEBU: 2.5000 mg | INHALATION_SOLUTION | Freq: Four times a day (QID) | RESPIRATORY_TRACT | Status: DC | PRN

## 2024-01-28 MED ORDER — HYDRALAZINE HCL 20 MG/ML IJ SOLN: 10.0000 mg | Freq: Four times a day (QID) | INTRAMUSCULAR | Status: DC | PRN

## 2024-01-28 MED ORDER — THIAMINE HCL 100 MG/ML IJ SOLN
100.0000 mg | Freq: Every day | INTRAMUSCULAR | Status: DC
  Administered 2024-01-29: 100 mg via INTRAVENOUS
  Filled 2024-01-28 (×3): qty 2

## 2024-01-28 MED ORDER — IOHEXOL 350 MG/ML SOLN
75.0000 mL | Freq: Once | INTRAVENOUS | Status: AC | PRN
  Administered 2024-01-28: 75 mL via INTRAVENOUS

## 2024-01-28 MED ORDER — SODIUM CHLORIDE 0.9 % IV SOLN
2.0000 g | INTRAVENOUS | Status: DC
  Administered 2024-01-29 – 2024-01-30 (×2): 2 g via INTRAVENOUS
  Filled 2024-01-28 (×2): qty 20

## 2024-01-28 MED ORDER — SODIUM CHLORIDE 0.9 % IV SOLN
2.0000 g | Freq: Once | INTRAVENOUS | Status: AC
  Administered 2024-01-28: 2 g via INTRAVENOUS
  Filled 2024-01-28: qty 20

## 2024-01-28 MED ORDER — POTASSIUM CHLORIDE 10 MEQ/100ML IV SOLN
10.0000 meq | INTRAVENOUS | Status: AC
  Administered 2024-01-28 (×4): 10 meq via INTRAVENOUS
  Filled 2024-01-28 (×2): qty 100

## 2024-01-28 MED ORDER — PANTOPRAZOLE SODIUM 40 MG IV SOLR
40.0000 mg | Freq: Once | INTRAVENOUS | Status: AC
  Administered 2024-01-28: 40 mg via INTRAVENOUS
  Filled 2024-01-28: qty 10

## 2024-01-28 MED ORDER — POLYETHYLENE GLYCOL 3350 17 G PO PACK: 17.0000 g | PACK | Freq: Every day | ORAL | Status: DC | PRN

## 2024-01-28 MED ORDER — SODIUM CHLORIDE 0.9 % IV SOLN: INTRAVENOUS | Status: DC

## 2024-01-28 MED ORDER — FOLIC ACID 1 MG PO TABS
1.0000 mg | ORAL_TABLET | Freq: Every day | ORAL | Status: DC
  Administered 2024-01-28 – 2024-01-31 (×4): 1 mg via ORAL
  Filled 2024-01-28 (×4): qty 1

## 2024-01-28 MED ORDER — LORAZEPAM 2 MG/ML IJ SOLN: 1.0000 mg | INTRAMUSCULAR | Status: DC | PRN

## 2024-01-28 MED ORDER — LORAZEPAM 1 MG PO TABS: 1.0000 mg | ORAL_TABLET | ORAL | Status: DC | PRN

## 2024-01-28 MED ORDER — NICOTINE 21 MG/24HR TD PT24
21.0000 mg | MEDICATED_PATCH | Freq: Every day | TRANSDERMAL | Status: DC
  Filled 2024-01-28 (×3): qty 1

## 2024-01-28 MED ORDER — ADULT MULTIVITAMIN W/MINERALS CH
1.0000 | ORAL_TABLET | Freq: Every day | ORAL | Status: DC
  Administered 2024-01-28 – 2024-01-31 (×4): 1 via ORAL
  Filled 2024-01-28 (×4): qty 1

## 2024-01-28 MED ORDER — BISACODYL 5 MG PO TBEC: 5.0000 mg | DELAYED_RELEASE_TABLET | Freq: Every day | ORAL | Status: DC | PRN

## 2024-01-28 MED ORDER — ONDANSETRON HCL 4 MG/2ML IJ SOLN
INTRAMUSCULAR | Status: AC
  Filled 2024-01-28: qty 2

## 2024-01-28 MED ORDER — OXYCODONE HCL 5 MG PO TABS
5.0000 mg | ORAL_TABLET | ORAL | Status: AC
  Administered 2024-01-28: 5 mg via ORAL
  Filled 2024-01-28: qty 1

## 2024-01-28 MED ORDER — THIAMINE MONONITRATE 100 MG PO TABS
100.0000 mg | ORAL_TABLET | Freq: Every day | ORAL | Status: DC
  Administered 2024-01-28 – 2024-01-31 (×3): 100 mg via ORAL
  Filled 2024-01-28 (×3): qty 1

## 2024-01-28 MED ORDER — ONDANSETRON HCL 4 MG/2ML IJ SOLN
4.0000 mg | Freq: Once | INTRAMUSCULAR | Status: AC
  Administered 2024-01-28: 4 mg via INTRAVENOUS
  Filled 2024-01-28: qty 2

## 2024-01-28 NOTE — Consult Note (Cosign Needed Addendum)
 Erika Morris, Erika Morris  [id number].    Requesting MD: Emil, MD Chief Complaint/Reason for Consult: cholelithiasis, jaundice   HPI:  Erika Morris is a 38 y/o F who presents with cc abdominal distention and constipation.  She tells me that her last normal bowel movement was 2 weeks ago.  Since that time she reports progressive abdominal distention and fullness associated with decreased oral intake and 1 episode of vomiting.  In the last 2 weeks she reports a handful of loose stools, 2-3 of which were black.  Because of concern for constipation she did take an OTC stool softener and miralax, as well as eating spinach. Until 2 weeks ago she reports having a bowel movement twice a day described as brown and formed. She denies severe abdominal pain.  For the last 10 years she reports heartburn whenever she eats tomato sauce that usually lasts 45 minutes and is associated with vomiting occasionally.  She denies postprandial abdominal pain when she eats spicy or greasy meals.   She denies a known history of gastric ulcers.  She denies any known liver issues.  She reports smoking a pack of cigarettes daily.  She reports heavy alcohol use, drinking 15 beers daily but recently cut back to 2 or 3 beers daily.  Denies marijuana, cocaine, or IV drug use.  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  She lives at home with her husband who travels a lot for work, they have 2 children ages 39 and 5.  Surgical history significant for appendectomy in 2001 and laparoscopic tubal ligation.  She denies a history of upper endoscopy or colonoscopy.  ROS: As above Review of Systems  All other systems reviewed and are negative.   History reviewed. No pertinent family history.  Past Medical History:  Diagnosis Date   Heart murmur     Past Surgical History:  Procedure Laterality Date   APPENDECTOMY     TUBAL LIGATION      Social History:  reports that she has been smoking cigarettes. She has never used  smokeless tobacco. She reports current alcohol use. She reports current drug use. Drug: Marijuana.  Allergies: No Known Allergies  (Not in a hospital admission)    Physical Exam: Blood pressure 103/60, pulse (!) 115, temperature 98.4 F (36.9 C), temperature source Oral, resp. rate 16, height 5' 8 (1.727 m), weight 66.2 kg, SpO2 100%. General: Cooperative white female in no acute distress, jaundiced HEENT: head -normocephalic, atraumatic; Eyes: PERRLA, no conjunctival injection, scleral icterus present Neck- Trachea is midline, no thyromegaly or JVD appreciated.  CV-sinus tachycardia, no lower extremity edema Pulm- breathing is non-labored ORA Abd- soft, moderate distention, overall nontender, negative Murphy sign, no palpable hernias, no peritonitis GU- deferred  MSK- UE/LE symmetrical, no cyanosis, clubbing, or edema. Neuro- CN II-XII grossly in tact, no paresthesias. Psych- Alert and Oriented x3 with appropriate affect Skin: warm and dry, no rashes or lesions   Results for orders placed or performed during the hospital encounter of 01/28/24 (from the past 48 hours)  Comprehensive metabolic panel     Status: Abnormal   Collection Time: 01/28/24 11:40 AM  Result Value Ref Range   Sodium 129 (L) 135 - 145 mmol/L   Potassium 3.1 (L) 3.5 - 5.1 mmol/L   Chloride 93 (L) 98 - 111 mmol/L   CO2 21 (L) 22 - 32 mmol/L   Glucose, Bld 85 70 - 99 mg/dL    Comment: Glucose reference range applies only to  samples taken after fasting for at least 8 hours.   BUN <5 (L) 6 - 20 mg/dL   Creatinine, Ser 9.30 0.44 - 1.00 mg/dL   Calcium 7.9 (L) 8.9 - 10.3 mg/dL   Total Protein 7.1 6.5 - 8.1 g/dL   Albumin 1.8 (L) 3.5 - 5.0 g/dL   AST 845 (H) 15 - 41 U/L   ALT 27 0 - 44 U/L   Alkaline Phosphatase 182 (H) 38 - 126 U/L   Total Bilirubin 11.5 (H) 0.0 - 1.2 mg/dL   GFR, Estimated >39 >39 mL/min    Comment: (NOTE) Calculated using the CKD-EPI Creatinine Equation (2021)    Anion gap 15 5 - 15     Comment: Performed at [facility], [address] [place], [place] [address]  Lipase, blood     Status: None   Collection Time: 01/28/24 11:40 AM  Result Value Ref Range   Lipase 27 11 - 51 U/L    Comment: Performed at [facility], [address], [place], [place] [address]  CBC with Diff     Status: Abnormal   Collection Time: 01/28/24 11:40 AM  Result Value Ref Range   WBC 11.7 (H) 4.0 - 10.5 K/uL   RBC 2.16 (L) 3.87 - 5.11 MIL/uL   Hemoglobin 7.9 (L) 12.0 - 15.0 g/dL   HCT 76.6 (L) 63.9 - 53.9 %   MCV 107.9 (H) 80.0 - 100.0 fL   MCH 36.6 (H) 26.0 - 34.0 pg   MCHC 33.9 30.0 - 36.0 g/dL   RDW 84.9 88.4 - 84.4 %   Platelets 244 150 - 400 K/uL   nRBC 0.0 0.0 - 0.2 %   Neutrophils Relative % 72 %   Neutro Abs 8.4 (H) 1.7 - 7.7 K/uL   Lymphocytes Relative 17 %   Lymphs Abs 2.0 0.7 - 4.0 K/uL   Monocytes Relative 9 %   Monocytes Absolute 1.1 (H) 0.1 - 1.0 K/uL   Eosinophils Relative 1 %   Eosinophils Absolute 0.1 0.0 - 0.5 K/uL   Basophils Relative 0 %   Basophils Absolute 0.1 0.0 - 0.1 K/uL   Immature Granulocytes 1 %   Abs Immature Granulocytes 0.07 0.00 - 0.07 K/uL    Comment: Performed at [facility], [address] [place], [place] [address]  hCG, serum, qualitative     Status: None   Collection Time: 01/28/24 11:40 AM  Result Value Ref Range   Preg, Serum NEGATIVE NEGATIVE    Comment:        THE SENSITIVITY OF THIS METHODOLOGY IS >10 mIU/mL. Performed at [facility], [address], [place], [place] [address]   Protime-INR     Status: Abnormal   Collection Time: 01/28/24 11:40 AM  Result Value Ref Range   Prothrombin Time 19.7 (H) 11.4 - 15.2 seconds   INR 1.6 (H) 0.8 - 1.2    Comment: (NOTE) INR goal varies based on device and disease states. Performed at [facility], [address] [place], [place] [address]   Ethanol     Status: Abnormal   Collection Time: 01/28/24 11:40 AM  Result Value Ref Range   Alcohol, Ethyl (B) 94  (H) <15 mg/dL    Comment: (NOTE) For medical purposes only. Performed at [facility], [address] [place], [place] [address]   Urinalysis, Routine w reflex microscopic -Urine, Clean Catch     Status: Abnormal   Collection  Time: 01/28/24 11:58 AM  Result Value Ref Range   Color, Urine AMBER (A) YELLOW    Comment: BIOCHEMICALS MAY BE AFFECTED BY COLOR   APPearance HAZY (A) CLEAR   Specific Gravity, Urine 1.003 (L) 1.005 - 1.030   pH 6.0 5.0 - 8.0   Glucose, UA NEGATIVE NEGATIVE mg/dL   Hgb urine dipstick NEGATIVE NEGATIVE   Bilirubin Urine NEGATIVE NEGATIVE   Ketones, ur NEGATIVE NEGATIVE mg/dL   Protein, ur NEGATIVE NEGATIVE mg/dL   Nitrite POSITIVE (A) NEGATIVE   Leukocytes,Ua NEGATIVE NEGATIVE   RBC / HPF 0-5 0 - 5 RBC/hpf   WBC, UA 0-5 0 - 5 WBC/hpf   Bacteria, UA RARE (A) NONE SEEN   Squamous Epithelial / HPF 11-20 0 - 5 /HPF   Amorphous Crystal PRESENT     Comment: Performed at [facility], [address], [place], [place] [address]   US  Abdomen Limited RUQ (LIVER/GB) Result Date: 01/28/2024 CLINICAL DATA:  Abdominal pain. EXAM: ULTRASOUND ABDOMEN LIMITED RIGHT UPPER QUADRANT COMPARISON:  None Available. FINDINGS: Gallbladder: Shadowing echogenic stone measures 8 mm. Slight wall thickening, 4 mm. Positive sonographic Murphy sign. Pericholecystic fluid. Common bile duct: Diameter: 3 mm, within normal limits. No intrahepatic biliary ductal dilatation. Liver: Diffusely increased in echogenicity. Poor signal in the portal vein on Doppler imaging with limited assessment of flow direction, which may be reversed. Other: None. IMPRESSION: 1. Acute cholecystitis. 2. Poor signal in the portal vein with questionable reversal of flow, worrisome for portal vein thrombosis. 3. Hepatic steatosis. Electronically Signed   By: Newell Eke M.D.   On: 01/28/2024 14:20      Assessment/Plan Cholelithiasis Abdominal pain Jaundice  Elevated LFTs Leukocytosis Macrocytic  anemia -hemoglobin is 7.9, 11 months ago was 12.1 Melena Alcohol abuse - ethanol 94 on admission Tobacco use 38 year old female with 2 weeks of progressive abdominal distention who now presents with jaundice and melena.  In the ED she is in no acute distress, heart rate 115 bpm, normotensive.  WBC 11.  CMP notable for a total bilirubin of 11.5, was 0.5 11 months ago.  Mild elevations in AST and alk phos. lipase is within normal limits. hyponatremia 129.  Albumin 1.8.  PT 19, INR 1.6. right upper quadrant ultrasound was performed in the emergency department which showed a gallstone, 4 mm gallbladder wall, pericholecystic fluid, and a positive sonographic Murphy sign it also noted poor signal in the portal vein concerning for possible portal venous thrombosis. CBD 3mm (WNL).  While the patient does have gallstones and choledocholithiasis cannot be excluded at this point, her symptoms of jaundice, abdominal swelling, and right upper quadrant pain are concerning for acute liver pathology.  Additionally, her common bile duct is a normal caliber on ultrasound which argues against an obstructing common duct stone.  CT scan of the abdomen and pelvis is pending which will also offer additional information.  Agree with GI consult, may need upper endoscopy this admission for melena.  [facility] will follow along.  No emergent role for surgery at present.    I reviewed nursing notes, ED provider notes, hospitalist notes, last 24 h vitals and pain scores, last 48 h intake and output, last 24 h labs and trends, and last 24 h imaging results.  Almarie GORMAN Pringle, [facility] 01/28/2024, 3:43 PM Please see Amion for pager number during day hours 7:00am-4:30pm or 7:00am -11:30am on weekends

## 2024-01-28 NOTE — ED Triage Notes (Signed)
 Patient reports constipation x 2 weeks, also states she looks 6 months pregnant and her abdomen is distended. Patient is jaundice, reports shortness of breath d/t difficulty taking deep breaths, no known hx of liver problems. Patient states she bruises easily all the time; drinks 2-3 8% 12oz beers a night.

## 2024-01-28 NOTE — H&P (Signed)
 [facility] History and Physical  Erika Morris FMW:[id number] DOB: 1985/09/23 DOA: 01/28/2024 PCP: Patient, No Pcp Per  Presented from: Home Chief Complaint: abd pain, distension.  History of Present Illness: Erika Morris is a 38 y.o. female with chronic alcoholism, no known liver issues, chronic smoking. Was drinking 15 beers daily, recently cut back to 2-3 8% 12 ounce beers a night. Smokes 1 pack/day.  Denies any drug use. Lives at home with her husband and 2 kids. Works as a Chief Financial Officer.  Patient presented to the ED today with multiple complaints.  For the last several days, has noticed progressive abdominal distention, jaundice, shortness of breath.  States she bruises easily. Reports abdominal pain, nausea, vomiting for 2 weeks.  Her last normal bowel movement was 2 weeks ago.  In the last 2 weeks, she has had couple of loose stools, 2-3 of them were black.  In the ED, afebrile, tachycardic to 110s, blood pressure in low normal range, breathing room air. Labs with WBC count 11.7, hemoglobin 7.9, platelets 244, sodium 129, potassium 3.1, BUN/creatinine < 5/0.6, albumin 1.8, AST 154, alk phos 182, total bili elevated 11.5 Blood alcohol level elevated at 94 Serum pregnancy test negative Urinalysis showed hazy amber color urine with positive nitrite, rare bacteria Abdomen ultrasound showed - Acute cholecystitis - Hepatic steatosis - Questionable portal vein thrombosis  CT abdomen and pelvis pending  Patient was given IV Protonix, IV Rocephin EDP spoke with [facility], general surgery Hospitalist service consulted for inpatient management  At the time of my evaluation, patient was lying on bed.  Not in distress. History reviewed and detailed as above. Does not think she he is at risk of withdrawal symptoms.  States she can quit alcohol easily overall.  She seems to be minimizing the severity of her problem.  Review of Systems:  All systems were reviewed and were negative  unless otherwise mentioned in the HPI   Past medical history: Past Medical History:  Diagnosis Date   Heart murmur     Past surgical history: Past Surgical History:  Procedure Laterality Date   APPENDECTOMY     TUBAL LIGATION      Social History:  reports that she has been smoking cigarettes. She has never used smokeless tobacco. She reports current alcohol use. She reports current drug use. Drug: Marijuana.  Allergies:  No Known Allergies Patient has no known allergies.   Family history:  History reviewed. No pertinent family history.   Physical Exam: Vitals:   01/28/24 1048 01/28/24 1137 01/28/24 1440  BP: 110/68  103/60  Pulse: (!) 119  (!) 115  Resp: 17  16  Temp: 98.8 F (37.1 C)  98.4 F (36.9 C)  TempSrc:   Oral  SpO2: 98%  100%  Weight:  66.2 kg   Height:  5' 8 (1.727 m)    Wt Readings from Last 3 Encounters:  01/28/24 66.2 kg  10/13/23 59 kg  10/10/22 59 kg   Body mass index is 22.2 kg/m.  General exam: Pleasant, young Caucasian female Skin: No rashes, lesions or ulcers. HEENT: Atraumatic, normocephalic, no obvious bleeding.  Conjunctival icterus bilaterally Lungs: Clear to auscultation bilaterally,  CVS: S1, S2, no murmur,   GI/Abd: Soft, mild diffuse tenderness, bowel sound present,   CNS: Alert, awake, oriented x 3.  No tremors Psychiatry: Mood appropriate Extremities: Trace bilateral pedal edema, no calf tenderness,    ----------------------------------------------------------------------------------------------------------------------------------------- ----------------------------------------------------------------------------------------------------------------------------------------- -----------------------------------------------------------------------------------------------------------------------------------------  Assessment/Plan: Acute GI bleeding Presented with intermittent black stool, low hemoglobin in the  setting of  chronic alcohol use Hemoglobin low at 7.9.  Baseline hemoglobin 12.1 from a year ago GI consulted.  Started on IV Protonix, clear liquid diet.  Acute blood loss anemia Chronic macrocytic anemia Hemoglobin low at 7.9.  Baseline hemoglobin 12.1 from a year ago MCV over 100, likely from alcohol. Continue to monitor.  Transfuse if less than 7 or bleeding. Obtain anemia panel Recent Labs    02/11/23 1742 01/28/24 1140  HGB 12.1 7.9*  MCV 100.3* 107.9*   Intractable nausea, vomiting  Looks dehydrated.  Reports anorexia Start NS at 75 mL/h  Hepatic steatosis Elevated liver enzymes and bilirubin Secondary to alcohol use Bilirubin severely elevated to 11.5 but imaging did not mention cirrhosis of liver. MELD sodium score 26 Trend LFTs.  Obtain ammonia level Recent Labs  Lab 01/28/24 1140  AST 154*  ALT 27  ALKPHOS 182*  BILITOT 11.5*  PROT 7.1  ALBUMIN 1.8*  INR 1.6*  LIPASE 27  PLT 244     Chronic alcohol use  High risk of alcohol withdrawal Was drinking 15 beers daily, recently cut back to 2-3 8% 12 ounce beers a night. Blood alcohol level at presentation elevated to 94. Started CIWA protocol with as needed Ativan . Multivitamins  Acute cholecystitis Per abdominal ultrasound.  General surgery consulted.  Pending CT abdomen Given IV Rocephin in the ED.  Continue same for now Recent Labs  Lab 01/28/24 1140  WBC 11.7*   Hypokalemia Potassium low at 3.1.  Replacement ordered Obtain mag and Phos Recent Labs  Lab 01/28/24 1140  K 3.1*   Hyponatremia Hypervolemic hyponatremia Recent Labs  Lab 01/28/24 1140  NA 129*   Chronic smoker Smokes 1 pack/day.  Counseled to quit.  Nicotine patch offered   Mobility: Independent at baseline.  Encourage ambulation PT Orders:   PT Follow up Rec:    Goals of care:   Code Status: Full Code    DVT prophylaxis:  SCDs Start: 01/28/24 1540   Antimicrobials: IV Rocephin Fluid: Start NS at 75 mL/h as patient is  unable to tolerate any oral intake Consultants: GI, surgery Family Communication: None at bedside  Status: Inpatient Level of care:  Telemetry Medical   Patient is from: Home Anticipated d/c to: Pending clinical course  Diet:  Diet Order             Diet NPO time specified  Diet effective now                    ------------------------------------------------------------------------------------- Severity of Illness: The appropriate patient status for this patient is INPATIENT. Inpatient status is judged to be reasonable and necessary in order to provide the required intensity of service to ensure the patient's safety. The patient's presenting symptoms, physical exam findings, and initial radiographic and laboratory data in the context of their chronic comorbidities is felt to place them at high risk for further clinical deterioration. Furthermore, it is not anticipated that the patient will be medically stable for discharge from the hospital within 2 midnights of admission.   * I certify that at the point of admission it is my clinical judgment that the patient will require inpatient hospital care spanning beyond 2 midnights from the point of admission due to high intensity of service, high risk for further deterioration and high frequency of surveillance required.* -------------------------------------------------------------------------------------   Home Meds: Prior to Admission medications   Medication Sig Start Date End Date Taking? Authorizing Provider  clindamycin  (CLEOCIN ) 150 MG capsule Take  3 capsules (450 mg total) by mouth 3 (three) times daily. 02/11/23   Ruthell Lonni FALCON, PA-C  EPINEPHrine  0.3 mg/0.3 mL IJ SOAJ injection Inject 0.3 mg into the muscle as needed for anaphylaxis. 08/14/22   Suzette Pac, MD  gabapentin  (NEURONTIN ) 300 MG capsule Take 1 capsule (300 mg total) by mouth every 8 (eight) hours as needed (For pain and itching). 11/19/23   Veta Palma,  PA-C    Labs on Admission:   CBC: Recent Labs  Lab 01/28/24 1140  WBC 11.7*  NEUTROABS 8.4*  HGB 7.9*  HCT 23.3*  MCV 107.9*  PLT 244    Basic Metabolic Panel: Recent Labs  Lab 01/28/24 1140  NA 129*  K 3.1*  CL 93*  CO2 21*  GLUCOSE 85  BUN <5*  CREATININE 0.69  CALCIUM 7.9*    Liver Function Tests: Recent Labs  Lab 01/28/24 1140  AST 154*  ALT 27  ALKPHOS 182*  BILITOT 11.5*  PROT 7.1  ALBUMIN 1.8*   Recent Labs  Lab 01/28/24 1140  LIPASE 27   No results for input(s): AMMONIA in the last 168 hours.  Cardiac Enzymes: No results for input(s): CKTOTAL, CKMB, CKMBINDEX, TROPONINI in the last 168 hours.  BNP (last 3 results) No results for input(s): BNP in the last 8760 hours.  ProBNP (last 3 results) No results for input(s): PROBNP in the last 8760 hours.  CBG: No results for input(s): GLUCAP in the last 168 hours.  Lipase     Component Value Date/Time   LIPASE 27 01/28/2024 1140     Urinalysis    Component Value Date/Time   COLORURINE AMBER (A) 01/28/2024 1158   APPEARANCEUR HAZY (A) 01/28/2024 1158   LABSPEC 1.003 (L) 01/28/2024 1158   PHURINE 6.0 01/28/2024 1158   GLUCOSEU NEGATIVE 01/28/2024 1158   HGBUR NEGATIVE 01/28/2024 1158   BILIRUBINUR NEGATIVE 01/28/2024 1158   KETONESUR NEGATIVE 01/28/2024 1158   PROTEINUR NEGATIVE 01/28/2024 1158   NITRITE POSITIVE (A) 01/28/2024 1158   LEUKOCYTESUR NEGATIVE 01/28/2024 1158     Drugs of Abuse     Component Value Date/Time   LABOPIA NONE DETECTED 08/28/2020 1326   COCAINSCRNUR NONE DETECTED 08/28/2020 1326   LABBENZ NONE DETECTED 08/28/2020 1326   AMPHETMU NONE DETECTED 08/28/2020 1326   THCU NONE DETECTED 08/28/2020 1326   LABBARB NONE DETECTED 08/28/2020 1326      Radiological Exams on Admission: CT ABDOMEN PELVIS W CONTRAST Result Date: 01/28/2024 CLINICAL DATA:  Abdominal pain, nausea vomiting, constipation, jaundice EXAM: CT ABDOMEN AND PELVIS WITH  CONTRAST TECHNIQUE: Multidetector CT imaging of the abdomen and pelvis was performed using the standard protocol following bolus administration of intravenous contrast. RADIATION DOSE REDUCTION: This exam was performed according to the departmental dose-optimization program which includes automated exposure control, adjustment of the mA and/or kV according to patient size and/or use of iterative reconstruction technique. CONTRAST:  75mL OMNIPAQUE  IOHEXOL  350 MG/ML SOLN COMPARISON:  01/28/2024 right upper quadrant ultrasound FINDINGS: Lower chest: No focal airspace consolidation or pleural effusion. Hepatobiliary: Hepatomegaly. Subtle nodular contour of the liver with irregular hypodensity throughout the liver parenchyma. There is masslike enhancement in the right hepatic lobe (axial 17), measuring 8.2 cm.Punctate radiopaque gallstone. Circumferential wall edema surrounding the decompressed gallbladder. No intrahepatic or extrahepatic biliary ductal dilation. The portal veins are patent. Small paraesophageal and perigastric varices. Pancreas: No mass or main ductal dilation. No peripancreatic inflammation or fluid collection. Spleen: Normal size. No mass. Adrenals/Urinary Tract: No adrenal masses. No renal mass.  No nephrolithiasis or hydronephrosis. The urinary bladder is distended without focal abnormality. Stomach/Bowel: The stomach is decompressed without focal abnormality. No small bowel wall thickening or inflammation. No small bowel obstruction.Decompressed appendiceal stump. Mild wall thickening of the right colon. Vascular/Lymphatic: No aortic aneurysm. Scattered aortoiliac atherosclerosis. No intraabdominal or pelvic lymphadenopathy. Reproductive: The uterus and ovaries are within normal limits for patient's age.No free pelvic fluid. Left adnexal tubal ligation clip. The right clip is displaced layering in the rectouterine pouch. Other: No pneumoperitoneum. Moderate volume ascites predominantly in the lower  abdomen and pelvis. Hazy mesenteric edema. Musculoskeletal: No acute fracture or destructive lesion. IMPRESSION: 1. Hepatomegaly. Subtle nodular contour of the liver, worrisome for cirrhosis. Irregular hypodensity throughout the liver parenchyma, most likely reflecting geographic hepatic steatosis. In the right hepatic lobe, there is masslike enhancement measuring 8.2 cm (axial 17), which most likely represents focal fatty sparing. Nonemergent multiphase abdominal MRI with IV contrast recommended to exclude underlying hepatic mass (such as HCC). 2. Small volume paraesophageal and perigastric varices with moderate volume ascites. Given the abnormal flow in the portal vein on the recent ultrasound, this constellation of findings is consistent with portal hypertension. No visualized portal vein thrombosis. 3. Circumferential wall edema involving the decompressed gallbladder. There is a single punctate radiopaque gallstone present. While this could represent changes of acute cholecystitis, gallbladder wall edema may be related to underlying chronic liver disease hypoproteinemia. If concern persists for acute cholecystitis, a nuclear medicine hepatobiliary scan would be recommended. 4. Mild wall thickening of the right colon, likely due to underdistention and portal colopathy. Aortic Atherosclerosis (ICD10-I70.0). Electronically Signed   By: Rogelia Myers M.D.   On: 01/28/2024 16:23   US  Abdomen Limited RUQ (LIVER/GB) Result Date: 01/28/2024 CLINICAL DATA:  Abdominal pain. EXAM: ULTRASOUND ABDOMEN LIMITED RIGHT UPPER QUADRANT COMPARISON:  None Available. FINDINGS: Gallbladder: Shadowing echogenic stone measures 8 mm. Slight wall thickening, 4 mm. Positive sonographic Murphy sign. Pericholecystic fluid. Common bile duct: Diameter: 3 mm, within normal limits. No intrahepatic biliary ductal dilatation. Liver: Diffusely increased in echogenicity. Poor signal in the portal vein on Doppler imaging with limited assessment  of flow direction, which may be reversed. Other: None. IMPRESSION: 1. Acute cholecystitis. 2. Poor signal in the portal vein with questionable reversal of flow, worrisome for portal vein thrombosis. 3. Hepatic steatosis. Electronically Signed   By: Newell Eke M.D.   On: 01/28/2024 14:20     Signed, Chapman Rota, MD [facility] 01/28/2024

## 2024-01-28 NOTE — Progress Notes (Signed)
 Subjective Erika Morris ID: Erika Morris is a 38 y.o. female.  Chief Complaint  Erika Morris presents with  . Constipation    Erika Morris complaining of constipation for one have half weeks. Erika Morris has been coughing as well having body pain from same. No known fevers. Erika Morris has taken multiple OTC with no relief. Last dose two days ago.     The following information was reviewed by members of the visit team:  Tobacco  Allergies  Meds  Med Hx  Surg Hx  OB Status  Fam Hx  Soc  Hx     HPI Erika Morris is a 38 year old who presents with 9 days of constipation, several days of abdominal distention and abdominal pain and 2 days of jaundice.  Erika Morris states she also think she has got pneumonia.  She states she has probably had pneumonia for a month.  She does smoke.  She does have a purulent cough with wheezing.  She states her husband noticed she was yellow yesterday.  She states she had a small bowel movement yesterday which was loose.  She states she had blood in her stool.  She denies fevers and chills.  She denies alcohol use.  She denies history of jaundice or liver disease.  She states she has vomited multiple times.  She has not vomited today but she vomited several times yesterday.  Review of Systems  Constitutional:  Positive for chills and fatigue.  HENT: Negative.    Eyes: Negative.   Respiratory:  Positive for cough and wheezing.   Cardiovascular: Negative.   Gastrointestinal:  Positive for abdominal distention, abdominal pain, diarrhea and nausea.  Endocrine: Negative.   Genitourinary: Negative.   Musculoskeletal: Negative.   Skin: Negative.   Allergic/Immunologic: Negative.   Neurological: Negative.   Hematological: Negative.   Psychiatric/Behavioral: Negative.      Objective Physical Exam Constitutional:      Appearance: Normal appearance.  HENT:     Head: Normocephalic and atraumatic.     Nose: Nose normal.     Mouth/Throat:     Mouth: Mucous membranes are moist.      Comments: Erika Morris has yellow buccal mucosa Eyes:     Conjunctiva/sclera: Conjunctivae normal.     Comments: Scleral icterus  Cardiovascular:     Rate and Rhythm: Normal rate and regular rhythm.  Pulmonary:     Effort: Pulmonary effort is normal.     Breath sounds: Normal breath sounds.  Abdominal:     Comments: Abdomen is distended and diffusely tender.  Musculoskeletal:        General: Normal range of motion.  Skin:    General: Skin is warm and dry.     Comments: Skin is jaundiced  Neurological:     General: No focal deficit present.     Mental Status: She is alert and oriented to person, place, and time.  Psychiatric:        Mood and Affect: Mood normal.     Assessment/Plan Diagnoses and all orders for this visit:  Jaundice  Generalized abdominal pain  Acute cough    This is a 38 year old female with a history of tobacco abuse who presents with abdominal pain, cough, constipation, nausea, vomiting, and jaundice.  Erika Morris had multiple vague complaints.  She is also had some loose stool yesterday.  She states that she has been constipated for 9 days.  She vomited multiple times yesterday.  She has had abdominal pain for several days.  She states that she had pneumonia for about  a month.  She had chills but denied fevers.  Differential diagnosis: Liver cirrhosis, hepatitis, Wilson's disease, drug-induced jaundice, hemolytic anemia, thalassemia, malignancy, posthepatic jaundice-gallstones, biliary stricture, pancreatitis cholangiocarcinoma.  Advised this Erika Morris that she was markedly jaundiced and along with her abdominal distention and tenderness I felt she would be best served in the emergency department where they can do stat labs and imaging as they deem necessary.  Erika Morris understood the importance of this evaluation and states that she would go immediately to an ED.  Her vitals are stable.  I do not think she needs EMS transport.  Urgent Care Disposition:  Follow up with  ED   Electronically signed: Lorane Esau Bob, PA-C 01/28/2024  9:56 AM

## 2024-01-28 NOTE — Consult Note (Addendum)
 [facility] Consult Note   History Erika Morris MRN # [id number]  Date of Admission: 01/28/2024 Date of Consultation: 01/28/2024 Referring physician: Dr. Arlice Reichert, MD Primary Care Provider: Patient, No Pcp Per Primary Gastroenterologist: None   Reason for Consultation/Chief Complaint: Generalized abdominal pain, patient discovered to be jaundiced  Subjective  HPI:  38 year old woman came to ED earlier today for about 2 weeks of progressive generalized abdominal discomfort with a sensation of bloating and reporting constipation over that period of time.  She reports having had a couple of small loose BMs but nothing liquid is normal for her.  She has tried some various over-the-counter and dietary treatments without much relief.  Had some black stool at 1 point, not clear if this was related to some over-the-counter treatments that she took.  All the symptoms are making her feel somewhat nauseated and early satiety even though she would like to eat.  Denies dysphagia vomiting or hematemesis. She drinks alcohol heavily, at 1 point up to a case of beer a day, but now she reports having cut that back significantly.  Decklyn reported that her last drink was sometime yesterday afternoon, then when I pointed out her positive alcohol level with this morning's ED labs, she admitted that to say she had had a drink earlier this morning.  She describes stress taking care of 2 children while she also works and her husband works out of town. No known personal or family history of liver disease. Takes no herbal or wellness supplements. ROS:  Constitutional: She describes a significant amount of weight loss in the last couple of years, but does not quantify it other than to say 1 point she was over 200 pounds. Eyes:   She had not noticed the yellowness of her eyes ENT:   No mouth sores Cardiovascular:   Denies chest pain Respiratory:   Denies dyspnea or cough Musculoskeletal:   No  arthralgias Urinary:   Denies dysuria or hematuria  All other systems are negative except as noted above in the HPI  Past Medical History Past Medical History:  Diagnosis Date   Heart murmur   Reports no chronic medical issues, not clear that she has regular preventative medical care  Past Surgical History Past Surgical History:  Procedure Laterality Date   APPENDECTOMY     TUBAL LIGATION      Family History History reviewed. No pertinent family history.  Social History Social History   Socioeconomic History   Marital status: Single    Spouse name: Not on file   Number of children: Not on file   Years of education: Not on file   Highest education level: Not on file  Occupational History   Not on file  Tobacco Use   Smoking status: Every Day    Current packs/day: 1.50    Types: Cigarettes   Smokeless tobacco: Never  Vaping Use   Vaping status: Never Used  Substance and Sexual Activity   Alcohol use: Yes    Comment: Socail drink   Drug use: Yes    Types: Marijuana   Sexual activity: Not on file  Other Topics Concern   Not on file  Social History Narrative   ** Merged History Encounter **       Social Drivers of Health   Financial Resource Strain: Not on file  Food Insecurity: Not on file  Transportation Needs: Not on file  Physical Activity: Not on file  Stress: Not on file  Social Connections: Unknown (06/26/2022)  Received from [employer]   Social Network    Social Network: Not on file    Allergies No Known Allergies  Outpatient Meds No medicines at home  Inpatient med list reviewed  _____________________________________________________________________ Objective   Exam:  Current vital signs  Patient Vitals for the past 8 hrs:  BP Temp Temp src Pulse Resp SpO2 Height Weight  01/28/24 1440 103/60 98.4 F (36.9 C) Oral (!) 115 16 100 % -- --  01/28/24 1137 -- -- -- -- -- -- 5' 8 (1.727 m) 66.2 kg  01/28/24 1048 110/68 98.8 F (37.1  C) -- (!) 119 17 98 % -- --   No intake or output data in the 24 hours ending 01/28/24 1631  Physical Exam:   General: this is a pleasant and conversational patient in no acute distress  Eyes: sclera icteric, no redness ENT: oral mucosa moist without lesions, no cervical or supraclavicular lymphadenopathy, CV: RRR without murmur, S1/S2, no JVD, mild bilateral pretibial and ankle edema Resp: clear to auscultation bilaterally, normal RR and effort noted GI: soft, upper tenderness, with active bowel sounds.  Protuberant, liver feels enlarged on inspiration, more so left lobe.  No hepatic bruit Skin; jaundiced Neuro: awake, alert and oriented x 3. Normal gross motor function and fluent speech.  No asterixis  Labs:     Latest Ref Rng & Units 01/28/2024   11:40 AM 02/11/2023    5:42 PM 08/28/2020    1:26 PM  CBC  WBC 4.0 - 10.5 K/uL 11.7  5.2  3.3   Hemoglobin 12.0 - 15.0 g/dL 7.9  87.8  86.2   Hematocrit 36.0 - 46.0 % 23.3  35.8  39.8   Platelets 150 - 400 K/uL 244  132  78        Latest Ref Rng & Units 01/28/2024   11:40 AM 02/11/2023    5:42 PM 08/28/2020    1:26 PM  CMP  Glucose 70 - 99 mg/dL 85  894  76   BUN 6 - 20 mg/dL <5  <5  5   Creatinine 0.44 - 1.00 mg/dL 9.30  9.62  9.64   Sodium 135 - 145 mmol/L 129  141  139   Potassium 3.5 - 5.1 mmol/L 3.1  3.7  3.8   Chloride 98 - 111 mmol/L 93  102  102   CO2 22 - 32 mmol/L 21  28  24    Calcium 8.9 - 10.3 mg/dL 7.9  9.5  9.3   Total Protein 6.5 - 8.1 g/dL 7.1  7.5  8.2   Total Bilirubin 0.0 - 1.2 mg/dL 88.4  0.5  0.9   Alkaline Phos 38 - 126 U/L 182  105  81   AST 15 - 41 U/L 154  74  175   ALT 0 - 44 U/L 27  29  94     Recent Labs  Lab 01/28/24 1140  INR 1.6*    EtOH level 94 this morning _________________________________________________________ Radiologic studies: CLINICAL DATA:  Abdominal pain.   EXAM: ULTRASOUND ABDOMEN LIMITED RIGHT UPPER QUADRANT   COMPARISON:  None Available.    FINDINGS: Gallbladder:   Shadowing echogenic stone measures 8 mm. Slight wall thickening, 4 mm. Positive sonographic Murphy sign. Pericholecystic fluid.   Common bile duct:   Diameter: 3 mm, within normal limits. No intrahepatic biliary ductal dilatation.   Liver:   Diffusely increased in echogenicity. Poor signal in the portal vein on Doppler imaging with limited assessment of flow direction,  which may be reversed.   Other: None.   IMPRESSION: 1. Acute cholecystitis. 2. Poor signal in the portal vein with questionable reversal of flow, worrisome for portal vein thrombosis. 3. Hepatic steatosis.     Electronically Signed   By: Newell Eke M.D.   On: 01/28/2024 14:20   _____________________________  CLINICAL DATA:  Abdominal pain, nausea vomiting, constipation, jaundice   EXAM: CT ABDOMEN AND PELVIS WITH CONTRAST   TECHNIQUE: Multidetector CT imaging of the abdomen and pelvis was performed using the standard protocol following bolus administration of intravenous contrast.   RADIATION DOSE REDUCTION: This exam was performed according to the departmental dose-optimization program which includes automated exposure control, adjustment of the mA and/or kV according to patient size and/or use of iterative reconstruction technique.   CONTRAST:  75mL OMNIPAQUE  IOHEXOL  350 MG/ML SOLN   COMPARISON:  01/28/2024 right upper quadrant ultrasound   FINDINGS: Lower chest: No focal airspace consolidation or pleural effusion.   Hepatobiliary: Hepatomegaly. Subtle nodular contour of the liver with irregular hypodensity throughout the liver parenchyma. There is masslike enhancement in the right hepatic lobe (axial 17), measuring 8.2 cm.Punctate radiopaque gallstone. Circumferential wall edema surrounding the decompressed gallbladder. No intrahepatic or extrahepatic biliary ductal dilation. The portal veins are patent. Small paraesophageal and perigastric varices.    Pancreas: No mass or main ductal dilation. No peripancreatic inflammation or fluid collection.   Spleen: Normal size. No mass.   Adrenals/Urinary Tract: No adrenal masses. No renal mass. No nephrolithiasis or hydronephrosis. The urinary bladder is distended without focal abnormality.   Stomach/Bowel: The stomach is decompressed without focal abnormality. No small bowel wall thickening or inflammation. No small bowel obstruction.Decompressed appendiceal stump. Mild wall thickening of the right colon.   Vascular/Lymphatic: No aortic aneurysm. Scattered aortoiliac atherosclerosis. No intraabdominal or pelvic lymphadenopathy.   Reproductive: The uterus and ovaries are within normal limits for patient's age.No free pelvic fluid. Left adnexal tubal ligation clip. The right clip is displaced layering in the rectouterine pouch.   Other: No pneumoperitoneum. Moderate volume ascites predominantly in the lower abdomen and pelvis. Hazy mesenteric edema.   Musculoskeletal: No acute fracture or destructive lesion.   IMPRESSION: 1. Hepatomegaly. Subtle nodular contour of the liver, worrisome for cirrhosis. Irregular hypodensity throughout the liver parenchyma, most likely reflecting geographic hepatic steatosis. In the right hepatic lobe, there is masslike enhancement measuring 8.2 cm (axial 17), which most likely represents focal fatty sparing. Nonemergent multiphase abdominal MRI with IV contrast recommended to exclude underlying hepatic mass (such as HCC). 2. Small volume paraesophageal and perigastric varices with moderate volume ascites. Given the abnormal flow in the portal vein on the recent ultrasound, this constellation of findings is consistent with portal hypertension. No visualized portal vein thrombosis. 3. Circumferential wall edema involving the decompressed gallbladder. There is a single punctate radiopaque gallstone present. While this could represent changes of acute  cholecystitis, gallbladder wall edema may be related to underlying chronic liver disease hypoproteinemia. If concern persists for acute cholecystitis, a nuclear medicine hepatobiliary scan would be recommended. 4. Mild wall thickening of the right colon, likely due to underdistention and portal colopathy.   Aortic Atherosclerosis (ICD10-I70.0).     Electronically Signed   By: Rogelia Myers M.D.   On: 01/28/2024 16:23   (CT images personally reviewed-H Danis MD)  ______________________________________________________ Other studies:   _______________________________________________________ Assessment & Plan  Impression:  Generalized abdominal pain with reported 2 weeks of constipation  Alcohol use disorder causing elevated LFTs in a mixed pattern. Overall  clinical picture strongly suggest cirrhosis with portal hypertension, and there is superimposed marked hepatomegaly from the ongoing alcohol use.  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  Coagulopathy as well.  Gallbladder stone.  I agree with our surgical colleagues that she most likely does not have acute cholecystitis, and I would not recommend surgical intervention at this time.  Gallbladder wall thickening and surrounding edema related to portal hypertension.  Abnormal imaging of the GI tract with the abnormal liver described as above.  There is a masslike abnormality that the radiologist favors to be focal fatty sparing.  Cannot entirely rule out hepatic neoplasia.  Macrocytic anemia, likely marrow suppression from alcohol use disorder.  Consider B12 and folate deficiency.  Plan:  While there was certainly significant clinical concern when we saw this patient due to the ultrasound report and the marked jaundice/icterus and hyperbilirubinemia, her CT abdomen and pelvis had not yet been reported by the time we saw her. However, those findings confirm our  concerns that she is cirrhotic with sequelae of portal hypertension.  CT scan findings and their significance will be discussed in greater detail with her when we round on her tomorrow.  I already conveyed my strong recommendation that she work toward alcohol abstinence as soon as possible.  On that note, she should also be watched closely for alcohol withdrawal during this hospitalization.  Hepatitis A, B, and C serologies, B12, folic acid, iron studies AFP and CMP with tomorrow morning's labs.  Volume and electrolyte replacement in the interim.  Eventual upper endoscopy, though we may elect to delay this until a greater time of alcohol abstinence passes for some possible improvement in the portal hypertension.  Review of the CT images shows that the ascites is primarily in and around the mesentery in the pelvis, therefore making it unlikely amenable to paracentesis.  Very likely portal hypertensive in nature.  She might need not be able to get the MRI of the liver pre and postcontrast prior to discharge, and we will wait for AFP results first.  This consultation required a high degree of medical decision making due to the nature and complexity of the acute condition(s) being evaluated as well as the patient's medical comorbidities.  Victory LITTIE Brand III Office: [phone]

## 2024-01-28 NOTE — ED Provider Triage Note (Signed)
 Emergency Medicine Provider Triage Evaluation Note  Erika Morris , a 38 y.o. female  was evaluated in triage.  Pt complains of abdominal pain and jaundice.  For several weeks has been having nausea and vomiting.  Also has some gastric abdominal pain.  Has noticed some abdominal swelling as well.  Has had constipation as well.  Does drink 2-3 alcoholic beverages at night that are 12 ounces.  Went to urgent care was referred into the emergency department for evaluation  Review of Systems  Positive: See above Negative: See above  Physical Exam  BP 110/68 (BP Location: Right Arm)   Pulse (!) 119   Temp 98.8 F (37.1 C)   Resp 17   SpO2 98%  Gen:   Awake, no distress   Resp:  Normal effort  MSK:   Moves extremities without difficulty  Other:  Abdominal distention diffuse abdominal tenderness palpation.  No rebound or guarding  Medical Decision Making  Medically screening exam initiated at 11:33 AM.  Appropriate orders placed.  Erika Morris was informed that the remainder of the evaluation will be completed by another provider, this initial triage assessment does not replace that evaluation, and the importance of remaining in the ED until their evaluation is complete.     Erika Lamar BROCKS, MD 01/28/24 [phone]

## 2024-01-28 NOTE — Discharge Summary (Signed)
 Report given to Duwayne Heck, RN.

## 2024-01-28 NOTE — ED Provider Notes (Signed)
 [place] EMERG[facility] DEPARTMENT AT [facility] Provider Note   CSN: [id number] Arrival date & time: 01/28/24  1044     Patient presents with: Constipation   Erika Morris is a 38 y.o. female.   38 yo F with chief complaint of abdominal pain nausea vomiting going on for at least 2 weeks.  Erika Morris has noted that her skin has become yellow and her abdomen has become very distended.  Pain to the abdomen all over worse with certain positions.  Erika Morris has been able to eat a very small amount and then becomes full and has to stop.  No fevers.  No bowel movement Erika Morris thinks in a couple weeks.  Prior to her constipation Erika Morris said her stool was very dark.     Constipation      Prior to Admission medications   Medication Sig Start Date End Date Taking? Authorizing Provider  clindamycin  (CLEOCIN ) 150 MG capsule Take 3 capsules (450 mg total) by mouth 3 (three) times daily. 02/11/23   Ruthell Lonni FALCON, PA-C  EPINEPHrine  0.3 mg/0.3 mL IJ SOAJ injection Inject 0.3 mg into the muscle as needed for anaphylaxis. 08/14/22   Suzette Pac, MD  gabapentin  (NEURONTIN ) 300 MG capsule Take 1 capsule (300 mg total) by mouth every 8 (eight) hours as needed (For pain and itching). 11/19/23   Veta Palma, PA-C    Allergies: Patient has no known allergies.    Review of Systems  Gastrointestinal:  Positive for constipation.    Updated Vital Signs BP 103/60 (BP Location: Left Arm)   Pulse (!) 115   Temp 98.4 F (36.9 C) (Oral)   Resp 16   Ht 5' 8 (1.727 m)   Wt 66.2 kg   SpO2 100%   BMI 22.20 kg/m   Physical Exam Vitals and nursing note reviewed.  Constitutional:      General: Erika Morris is not in acute distress.    Appearance: Erika Morris is well-developed. Erika Morris is not diaphoretic.     Comments: jaundiced  HENT:     Head: Normocephalic and atraumatic.  Eyes:     Pupils: Pupils are equal, round, and reactive to light.  Cardiovascular:     Rate and Rhythm: Regular rhythm. Tachycardia present.      Heart sounds: No murmur heard.    No friction rub. No gallop.  Pulmonary:     Effort: Pulmonary effort is normal.     Breath sounds: No wheezing or rales.  Abdominal:     General: There is distension.     Palpations: Abdomen is soft.     Tenderness: There is no abdominal tenderness.  Musculoskeletal:        General: No tenderness.     Cervical back: Normal range of motion and neck supple.  Skin:    General: Skin is warm and dry.  Neurological:     Mental Status: Erika Morris is alert and oriented to person, place, and time.  Psychiatric:        Behavior: Behavior normal.     (all labs ordered are listed, but only abnormal results are displayed) Labs Reviewed  COMPREHENSIVE METABOLIC PANEL WITH GFR - Abnormal; Notable for the following components:      Result Value   Sodium 129 (*)    Potassium 3.1 (*)    Chloride 93 (*)    CO2 21 (*)    BUN <5 (*)    Calcium 7.9 (*)    Albumin 1.8 (*)    AST 154 (*)  Alkaline Phosphatase 182 (*)    Total Bilirubin 11.5 (*)    All other components within normal limits  CBC WITH DIFFERENTIAL/PLATELET - Abnormal; Notable for the following components:   WBC 11.7 (*)    RBC 2.16 (*)    Hemoglobin 7.9 (*)    HCT 23.3 (*)    MCV 107.9 (*)    MCH 36.6 (*)    Neutro Abs 8.4 (*)    Monocytes Absolute 1.1 (*)    All other components within normal limits  URINALYSIS, ROUTINE W REFLEX MICROSCOPIC - Abnormal; Notable for the following components:   Color, Urine AMBER (*)    APPearance HAZY (*)    Specific Gravity, Urine 1.003 (*)    Nitrite POSITIVE (*)    Bacteria, UA RARE (*)    All other components within normal limits  PROTIME-INR - Abnormal; Notable for the following components:   Prothrombin Time 19.7 (*)    INR 1.6 (*)    All other components within normal limits  ETHANOL - Abnormal; Notable for the following components:   Alcohol, Ethyl (B) 94 (*)    All other components within normal limits  LIPASE, BLOOD  HCG, SERUM, QUALITATIVE   AMMONIA  TYPE AND SCREEN    EKG: None  Radiology: US  Abdomen Limited RUQ (LIVER/GB) Result Date: 01/28/2024 CLINICAL DATA:  Abdominal pain. EXAM: ULTRASOUND ABDOMEN LIMITED RIGHT UPPER QUADRANT COMPARISON:  None Available. FINDINGS: Gallbladder: Shadowing echogenic stone measures 8 mm. Slight wall thickening, 4 mm. Positive sonographic Murphy sign. Pericholecystic fluid. Common bile duct: Diameter: 3 mm, within normal limits. No intrahepatic biliary ductal dilatation. Liver: Diffusely increased in echogenicity. Poor signal in the portal vein on Doppler imaging with limited assessment of flow direction, which may be reversed. Other: None. IMPRESSION: 1. Acute cholecystitis. 2. Poor signal in the portal vein with questionable reversal of flow, worrisome for portal vein thrombosis. 3. Hepatic steatosis. Electronically Signed   By: Newell Eke M.D.   On: 01/28/2024 14:20     .Critical Care  Performed by: Emil Share, DO Authorized by: Emil Share, DO   Critical care provider statement:    Critical care time (minutes):  35   Critical care time was exclusive of:  Separately billable procedures and treating other patients   Critical care was time spent personally by me on the following activities:  Development of treatment plan with patient or surrogate, discussions with consultants, evaluation of patient's response to treatment, examination of patient, ordering and review of laboratory studies, ordering and review of radiographic studies, ordering and performing treatments and interventions, pulse oximetry, re-evaluation of patient's condition and review of old charts   Care discussed with: admitting provider      Medications Ordered in the ED  pantoprazole (PROTONIX) injection 40 mg (has no administration in time range)  cefTRIAXone (ROCEPHIN) 2 g in sodium chloride 0.9 % 100 mL IVPB (has no administration in time range)  iohexol  (OMNIPAQUE ) 350 MG/ML injection 75 mL (has no administration  in time range)  ondansetron (ZOFRAN) injection 4 mg (4 mg Intravenous Given 01/28/24 1210)  oxyCODONE (Oxy IR/ROXICODONE) immediate release tablet 5 mg (5 mg Oral Given 01/28/24 1209)                                    Medical Decision Making Amount and/or Complexity of Data Reviewed Labs: ordered.  Risk Prescription drug management.   38 yo F with  a chief complaints of abdominal pain nausea vomiting and constipation.  This been going on for at least 2 weeks.  Patient found to likely be in acute liver failure.  Erika Morris is tachycardic had been having dark stools prior to her constipation and has a 4 g drop in her last hemoglobin.  Right upper quadrant ultrasound was concerning for acute cholecystitis.  History is not obviously consistent.  INR elevated.  Hypokalemia.  With difficulty tolerating by mouth tachycardia dehydration and new liver failure likely would benefit from overnight stay.  Will discuss with gastroenterology.  Discussed with Amy Esterwood, GI will see at bedside.   I discussed case with Vertell, general surgery evaluate the patient at bedside.  Discussed with medicine for admission.  The patients results and plan were reviewed and discussed.   Any x-rays performed were independently reviewed by myself.   Differential diagnosis were considered with the presenting HPI.  Medications  pantoprazole (PROTONIX) injection 40 mg (has no administration in time range)  cefTRIAXone (ROCEPHIN) 2 g in sodium chloride 0.9 % 100 mL IVPB (has no administration in time range)  iohexol  (OMNIPAQUE ) 350 MG/ML injection 75 mL (has no administration in time range)  ondansetron (ZOFRAN) injection 4 mg (4 mg Intravenous Given 01/28/24 1210)  oxyCODONE (Oxy IR/ROXICODONE) immediate release tablet 5 mg (5 mg Oral Given 01/28/24 1209)    Vitals:   01/28/24 1048 01/28/24 1137 01/28/24 1440  BP: 110/68  103/60  Pulse: (!) 119  (!) 115  Resp: 17  16  Temp: 98.8 F (37.1 C)  98.4 F (36.9  C)  TempSrc:   Oral  SpO2: 98%  100%  Weight:  66.2 kg   Height:  5' 8 (1.727 m)     Final diagnoses:  Jaundice  Acute anemia    Admission/ observation were discussed with the admitting physician, patient and/or family and they are comfortable with the plan.       Final diagnoses:  Jaundice  Acute anemia    ED Discharge Orders     None          Emil Share, DO 01/28/24 1541

## 2024-01-29 ENCOUNTER — Other Ambulatory Visit (HOSPITAL_COMMUNITY): Payer: Self-pay

## 2024-01-29 DIAGNOSIS — K766 Portal hypertension: Secondary | ICD-10-CM

## 2024-01-29 DIAGNOSIS — D689 Coagulation defect, unspecified: Secondary | ICD-10-CM

## 2024-01-29 DIAGNOSIS — K7460 Unspecified cirrhosis of liver: Secondary | ICD-10-CM

## 2024-01-29 DIAGNOSIS — K921 Melena: Principal | ICD-10-CM

## 2024-01-29 LAB — VITAMIN B12: Vitamin B-12: 720 pg/mL (ref 180–914)

## 2024-01-29 LAB — COMPREHENSIVE METABOLIC PANEL WITH GFR
ALT: 25 U/L (ref 0–44)
AST: 125 U/L — ABNORMAL HIGH (ref 15–41)
Albumin: 1.6 g/dL — ABNORMAL LOW (ref 3.5–5.0)
Alkaline Phosphatase: 153 U/L — ABNORMAL HIGH (ref 38–126)
Anion gap: 11 (ref 5–15)
BUN: <5 mg/dL — ABNORMAL LOW (ref 6–20)
CO2: 21 mmol/L — ABNORMAL LOW (ref 22–32)
Calcium: 7.5 mg/dL — ABNORMAL LOW (ref 8.9–10.3)
Chloride: 97 mmol/L — ABNORMAL LOW (ref 98–111)
Creatinine, Ser: 0.7 mg/dL (ref 0.44–1.00)
GFR, Estimated: >60 mL/min (ref >60)
Glucose, Bld: 63 mg/dL — ABNORMAL LOW (ref 70–99)
Potassium: 3.4 mmol/L — ABNORMAL LOW (ref 3.5–5.1)
Sodium: 129 mmol/L — ABNORMAL LOW (ref 135–145)
Total Bilirubin: 12.5 mg/dL — ABNORMAL HIGH (ref 0.0–1.2)
Total Protein: 6.6 g/dL (ref 6.5–8.1)

## 2024-01-29 LAB — CBC
HCT: 18.9 % — ABNORMAL LOW (ref 36.0–46.0)
Hemoglobin: 6.3 g/dL — CL (ref 12.0–15.0)
MCH: 37.7 pg — ABNORMAL HIGH (ref 26.0–34.0)
MCHC: 33.3 g/dL (ref 30.0–36.0)
MCV: 113.2 fL — ABNORMAL HIGH (ref 80.0–100.0)
Platelets: 194 K/uL (ref 150–400)
RBC: 1.67 MIL/uL — ABNORMAL LOW (ref 3.87–5.11)
RDW: 15.6 % — ABNORMAL HIGH (ref 11.5–15.5)
WBC: 9.6 K/uL (ref 4.0–10.5)
nRBC: 0 % (ref 0.0–0.2)

## 2024-01-29 LAB — FERRITIN: Ferritin: 245 ng/mL (ref 11–307)

## 2024-01-29 LAB — MAGNESIUM: Magnesium: 1.8 mg/dL (ref 1.7–2.4)

## 2024-01-29 LAB — PHOSPHORUS: Phosphorus: 4.1 mg/dL (ref 2.5–4.6)

## 2024-01-29 LAB — HEPATITIS A ANTIBODY, TOTAL: hep A Total Ab: REACTIVE — AB

## 2024-01-29 LAB — GLUCOSE, CAPILLARY
Glucose-Capillary: 110 mg/dL — ABNORMAL HIGH (ref 70–99)
Glucose-Capillary: 148 mg/dL — ABNORMAL HIGH (ref 70–99)

## 2024-01-29 LAB — HEPATITIS B SURFACE ANTIBODY,QUALITATIVE: Hep B S Ab: REACTIVE — AB

## 2024-01-29 LAB — HEPATITIS C ANTIBODY: HCV Ab: NONREACTIVE

## 2024-01-29 LAB — AMMONIA: Ammonia: 50 umol/L — ABNORMAL HIGH (ref 9–35)

## 2024-01-29 LAB — HIV ANTIBODY (ROUTINE TESTING W REFLEX): HIV Screen 4th Generation wRfx: NONREACTIVE

## 2024-01-29 LAB — TSH: TSH: 3.232 u[IU]/mL (ref 0.350–4.500)

## 2024-01-29 MED ORDER — POTASSIUM CHLORIDE CRYS ER 20 MEQ PO TBCR
40.0000 meq | EXTENDED_RELEASE_TABLET | Freq: Once | ORAL | Status: AC
  Administered 2024-01-29: 40 meq via ORAL
  Filled 2024-01-29: qty 2

## 2024-01-29 MED ORDER — INSULIN ASPART 100 UNIT/ML IJ SOLN
0.0000 [IU] | Freq: Three times a day (TID) | INTRAMUSCULAR | Status: DC
  Administered 2024-01-30: 1 [IU] via SUBCUTANEOUS

## 2024-01-29 MED ORDER — INSULIN ASPART 100 UNIT/ML IJ SOLN: 0.0000 [IU] | Freq: Every day | INTRAMUSCULAR | Status: DC

## 2024-01-29 MED ORDER — POTASSIUM CHLORIDE 10 MEQ/100ML IV SOLN
10.0000 meq | INTRAVENOUS | Status: DC
  Administered 2024-01-29: 10 meq via INTRAVENOUS
  Filled 2024-01-29: qty 100

## 2024-01-29 MED ORDER — PANTOPRAZOLE SODIUM 40 MG IV SOLR
40.0000 mg | Freq: Two times a day (BID) | INTRAVENOUS | Status: DC
  Administered 2024-01-29 – 2024-01-31 (×5): 40 mg via INTRAVENOUS
  Filled 2024-01-29 (×5): qty 10

## 2024-01-29 MED ORDER — SODIUM CHLORIDE 0.9% IV SOLUTION: Freq: Once | INTRAVENOUS | Status: AC

## 2024-01-29 MED ORDER — DEXTROSE 50 % IV SOLN: 50.0000 mL | INTRAVENOUS | Status: DC | PRN

## 2024-01-29 MED ORDER — PREDNISOLONE 5 MG PO TABS
40.0000 mg | ORAL_TABLET | Freq: Every day | ORAL | Status: DC
Start: 2024-01-29 — End: 2024-02-28
  Administered 2024-01-29 – 2024-01-31 (×3): 40 mg via ORAL
  Filled 2024-01-29 (×3): qty 8

## 2024-01-29 NOTE — Progress Notes (Addendum)
 Patient ID: Erika Morris, female   DOB: [birth year], 38 y.o.   MRN: [id number]    Progress Note   Subjective   Day # 2 CC; generalized abdominal pain//discomfort, early satiety, jaundice  CT abdomen and pelvis with contrast-significant hepatomegaly, subtle nodular contour of the liver/masslike enhancement in the right hepatic lobe measuring 8.2 cm, most likely represents focal fatty sparing nonemergent MRI with IV contrast recommended punctate radiopaque gallstone, circumferential wall edema surrounding the decompressed gallbladder no ductal dilation, portal veins patent, small paraesophageal and perigastric varices normal-appearing pancreas, mild wall thickening of the right colon likely due to underdistention  Labs-hep C antibody negative/hep B pending AFP pending Venous ammonia 50 Sodium 129/potassium 3.4/CO2 21/BUN less than 5/creatinine 0.7 Albumin 1.6 T. bili 12.5/alk phos 153/AST 125 WBC 9.6/hemoglobin 6.3/hematocrit 18.9/MCV 113.2  Discriminant function score=34  Patient says she feels a little better than yesterday, nerves okay no tremor able to tolerate some solid food today though not happy with a low-sodium diet.  Abdomen still feels full and uncomfortable, 1 very small dark/black stool today  Received 1 unit of packed RBCs this a.m. for hemoglobin 6.3    Objective   Vital signs in last 24 hours: Temp:  [97.2 F (36.2 C)-99 F (37.2 C)] 98.9 F (37.2 C) (10/21 0958) Pulse Rate:  [99-117] 116 (10/21 0800) Resp:  [14-24] 14 (10/21 0800) BP: (83-114)/(41-92) 112/68 (10/21 0800) SpO2:  [91 %-100 %] 98 % (10/21 0800) Weight:  [66.2 kg] 66.2 kg (10/20 1137) Last BM Date : 01/14/24 General:   Young white female in NAD, jaundiced Heart: Tachycardic regular rate and rhythm; no murmurs Lungs: Respirations even and unlabored, lungs CTA bilaterally Abdomen:  Soft, enlarged liver palpable across midline, bowel sounds are present, no palpable splenomegaly, no fluid  wave Extremities: 1+ edema bilateral lower extremities to shins Neurologic:  Alert and oriented,  grossly normal neurologically.  No tremor, no asterixis Psych:  Cooperative. Normal mood and affect.  Intake/Output from previous day: 10/20 0701 - 10/21 0700 In: 1262.7 [I.V.:906; IV Piggyback:356.7] Out: -  Intake/Output this shift: No intake/output data recorded.  Lab Results: Recent Labs    01/28/24 1140 01/29/24 0421  WBC 11.7* 9.6  HGB 7.9* 6.3*  HCT 23.3* 18.9*  PLT 244 194   BMET Recent Labs    01/28/24 1140 01/29/24 0421  NA 129* 129*  K 3.1* 3.4*  CL 93* 97*  CO2 21* 21*  GLUCOSE 85 63*  BUN <5* <5*  CREATININE 0.69 0.70  CALCIUM 7.9* 7.5*   LFT Recent Labs    01/29/24 0421  PROT 6.6  ALBUMIN 1.6*  AST 125*  ALT 25  ALKPHOS 153*  BILITOT 12.5*   PT/INR Recent Labs    01/28/24 1140  LABPROT 19.7*  INR 1.6*    Studies/Results: CT ABDOMEN PELVIS W CONTRAST Result Date: 01/28/2024 CLINICAL DATA:  Abdominal pain, nausea vomiting, constipation, jaundice EXAM: CT ABDOMEN AND PELVIS WITH CONTRAST TECHNIQUE: Multidetector CT imaging of the abdomen and pelvis was performed using the standard protocol following bolus administration of intravenous contrast. RADIATION DOSE REDUCTION: This exam was performed according to the departmental dose-optimization program which includes automated exposure control, adjustment of the mA and/or kV according to patient size and/or use of iterative reconstruction technique. CONTRAST:  75mL OMNIPAQUE  IOHEXOL  350 MG/ML SOLN COMPARISON:  01/28/2024 right upper quadrant ultrasound FINDINGS: Lower chest: No focal airspace consolidation or pleural effusion. Hepatobiliary: Hepatomegaly. Subtle nodular contour of the liver with irregular hypodensity throughout the liver parenchyma. There is  masslike enhancement in the right hepatic lobe (axial 17), measuring 8.2 cm.Punctate radiopaque gallstone. Circumferential wall edema surrounding the  decompressed gallbladder. No intrahepatic or extrahepatic biliary ductal dilation. The portal veins are patent. Small paraesophageal and perigastric varices. Pancreas: No mass or main ductal dilation. No peripancreatic inflammation or fluid collection. Spleen: Normal size. No mass. Adrenals/Urinary Tract: No adrenal masses. No renal mass. No nephrolithiasis or hydronephrosis. The urinary bladder is distended without focal abnormality. Stomach/Bowel: The stomach is decompressed without focal abnormality. No small bowel wall thickening or inflammation. No small bowel obstruction.Decompressed appendiceal stump. Mild wall thickening of the right colon. Vascular/Lymphatic: No aortic aneurysm. Scattered aortoiliac atherosclerosis. No intraabdominal or pelvic lymphadenopathy. Reproductive: The uterus and ovaries are within normal limits for patient's age.No free pelvic fluid. Left adnexal tubal ligation clip. The right clip is displaced layering in the rectouterine pouch. Other: No pneumoperitoneum. Moderate volume ascites predominantly in the lower abdomen and pelvis. Hazy mesenteric edema. Musculoskeletal: No acute fracture or destructive lesion. IMPRESSION: 1. Hepatomegaly. Subtle nodular contour of the liver, worrisome for cirrhosis. Irregular hypodensity throughout the liver parenchyma, most likely reflecting geographic hepatic steatosis. In the right hepatic lobe, there is masslike enhancement measuring 8.2 cm (axial 17), which most likely represents focal fatty sparing. Nonemergent multiphase abdominal MRI with IV contrast recommended to exclude underlying hepatic mass (such as HCC). 2. Small volume paraesophageal and perigastric varices with moderate volume ascites. Given the abnormal flow in the portal vein on the recent ultrasound, this constellation of findings is consistent with portal hypertension. No visualized portal vein thrombosis. 3. Circumferential wall edema involving the decompressed gallbladder. There  is a single punctate radiopaque gallstone present. While this could represent changes of acute cholecystitis, gallbladder wall edema may be related to underlying chronic liver disease hypoproteinemia. If concern persists for acute cholecystitis, a nuclear medicine hepatobiliary scan would be recommended. 4. Mild wall thickening of the right colon, likely due to underdistention and portal colopathy. Aortic Atherosclerosis (ICD10-I70.0). Electronically Signed   By: Rogelia Myers M.D.   On: 01/28/2024 16:23   US  Abdomen Limited RUQ (LIVER/GB) Result Date: 01/28/2024 CLINICAL DATA:  Abdominal pain. EXAM: ULTRASOUND ABDOMEN LIMITED RIGHT UPPER QUADRANT COMPARISON:  None Available. FINDINGS: Gallbladder: Shadowing echogenic stone measures 8 mm. Slight wall thickening, 4 mm. Positive sonographic Murphy sign. Pericholecystic fluid. Common bile duct: Diameter: 3 mm, within normal limits. No intrahepatic biliary ductal dilatation. Liver: Diffusely increased in echogenicity. Poor signal in the portal vein on Doppler imaging with limited assessment of flow direction, which may be reversed. Other: None. IMPRESSION: 1. Acute cholecystitis. 2. Poor signal in the portal vein with questionable reversal of flow, worrisome for portal vein thrombosis. 3. Hepatic steatosis. Electronically Signed   By: Newell Eke M.D.   On: 01/28/2024 14:20       Assessment / Plan:    #80  38 year old white female with history of heavy EtOH use presenting with 2-week history of abdominal discomfort, fullness and early satiety over the past several weeks, change in bowel habits with obstipation and also reports of dark/tarry stool intermittently who is seen at urgent care earlier on the day of admission noted to be jaundiced and directed to the emergency room.  Extensive workup in the ER yesterday with labs and imaging all concerning for new diagnosis of EtOH induced cirrhosis with portal hypertension, and coagulopathy.  She has a very  large liver on CT which likely explains her sensation of fullness and early satiety. In addition there is a masslike area  described on CT which may be focal fatty sparing but may require further imaging Await AFP  Maddrey discriminant function score today =34-criteria for initiation of steroids for acute EtOH hepatitis component of this picture.  #2 anemia, macrocytic with drop in hemoglobin overnight-he is not having active GI bleeding though does describe a very small dark stool this morning . Anemia likely multifactorial in setting of bone marrow suppression, possible B12 deficiency in addition to folate deficiency and component of GI blood loss-probable portal gastropathy  #3 portal hypertension with esophageal varices noted on imaging.  Plan; 2 g sodium high-protein diet N.p.o. after midnight Patient will be scheduled for EGD with Dr. Legrand tomorrow 01/30/2024.  Procedure was discussed in detail with the patient including indications risk and benefits and she is agreeable to proceed Continue to trend hemoglobin, transfuse to keep her hemoglobin above 7 Have added IV PPI twice daily for now Prednisolone 40 mg p.o. daily-recalculate Lille score in 7 days Await multiple pending labs If AFP elevated, will need MRI of the liver Patient counseled extensively today again regarding recommendation for complete alcohol abstinence forever moving forward GI will continue to follow with you    Principal Problem:   Acute GI bleeding     LOS: 1 day   Amy Esterwood PA-C 01/29/2024, 11:23 AM  I have taken an interval history, thoroughly reviewed the chart and examined the patient. I agree with the Advanced Practitioner's note, impression and recommendations, and have recorded additional findings, impressions and recommendations below.  Patient was seen along with my PA I performed a substantive portion of this encounter (>50% time spent), including a complete performance of the medical decision  making.  My additional thoughts are as follows:  Remains jaundiced and icteric. Additional data is negative hepatitis C antibody and HIV antibody.  She has positive hepatitis A antibody (indicating immunity) and a positive hepatitis B surface antibody (indicating prior exposure and clearance or vaccination-awaiting core antibody and surface antigen results)   She is alert and conversational today with no signs of encephalopathy or alcohol withdrawal Continues to have abdominal bloating and early satiety.  Recent constipation, small BM since admission.  Was describing some black stool at home, and was unclear if this may have been from something dietary or OTC medicine versus GI bleeding.  We had a long discussion about her cirrhosis diagnosis and potential indications of that with further testing needed. Ascites that does not require diuretics at present.  Multifactorial anemia with probable alcohol related marrow suppression, dietary deficiencies, awaiting B12 folic acid and iron levels.  We wonder if there may have been some recent GI bleeding contributing as well based on the history.  Her discriminant function is 33-34, though it is admittedly difficult to attribute this entirely to acute alcoholic hepatitis since her overall clinical picture suggests chronicity with cirrhosis development and most recent prehospital LFTs from November 2024. Nevertheless, given the severity of her condition and high 90-day mortality, we have decided to start prednisolone 40 mg once daily with a repeat score in 7 days.  Upper endoscopy tomorrow for evaluation of any potential GI bleeding source and evaluate for gastroesophageal varices.  She was agreeable after discussion of procedure and risks.  The benefits and risks of the planned procedure(s) were described in detail with the patient or (when appropriate) their health care proxy.  Risks were outlined as including, but not limited to, bleeding, infection,  perforation, adverse medication reaction leading to cardiac or pulmonary decompensation, pancreatitis (if ERCP).  The limitation of incomplete mucosal visualization was also discussed.  No guarantees or warranties were given.  Patient at increased risk for cardiopulmonary complications of procedure due to medical comorbidities.  _________________  This consultation required a high degree of medical decision making due to the nature and complexity of the acute condition(s) being evaluated as well as the patient's medical comorbidities.  Victory LITTIE Brand III Office:[phone]

## 2024-01-29 NOTE — Progress Notes (Signed)
 PROGRESS NOTE  Erika Morris  DOB: 1986/02/06  PCP: Patient, No Pcp Per FMW:[id number]  DOA: 01/28/2024  LOS: 1 day  Hospital Day: 2   Subjective: Patient was seen and examined this morning. Sitting up in bed.  Not in distress.  Husband at bedside Had a small dark/black stool today. Chart reviewed.   Remains tachycardic overnight with blood pressure 90s and 100s, breathing room air Labs this morning with sodium 129, potassium 3.4, glucose low at 63, ammonia 50, bilirubin further high at 12.5, hemoglobin low at 6.3   Brief narrative: Erika Morris is a 38 y.o. female with chronic alcoholism, no known liver issues, chronic smoking. Was drinking 15 beers daily, recently cut back to 2-3 8% 12 ounce beers a night. Smokes 1 pack/day.  Denies any drug use. Lives at home with her husband and 2 kids. Works as a Chief Financial Officer.  Patient presented to the ED today with multiple complaints.  For the last several days, has noticed progressive abdominal distention, jaundice, shortness of breath.  States she bruises easily. Reports abdominal pain, nausea, vomiting for 2 weeks.  Her last normal bowel movement was 2 weeks ago.  In the last 2 weeks, she has had couple of loose stools, 2-3 of them were black.  In the ED, afebrile, tachycardic to 110s, blood pressure in low normal range, breathing room air. Labs with WBC count 11.7, hemoglobin 7.9, platelets 244, sodium 129, potassium 3.1, BUN/creatinine < 5/0.6, albumin 1.8, AST 154, alk phos 182, total bili elevated 11.5 Blood alcohol level elevated at 94 Urinalysis showed hazy amber color urine with positive nitrite, rare bacteria Abdomen ultrasound showed Acute cholecystitis, Hepatic steatosis, Questionable portal vein thrombosis CT abdomen and pelvis - Hepatomegaly, early liver cirrhosis - Small volume paraesophageal and perigastric varices with moderate volume ascites.  Findings consistent with portal hypertension. - No visualized portal vein  thrombosis. - Gallbladder wall edema, single gallstone -suspected acute cystitis - mild wall thickening of the right colon, likely due to underdistention and portal colopathy.   Patient was given IV Protonix, IV Rocephin EDP spoke with [facility], general surgery Admitted to [facility]  Assessment/Plan: Acute GI bleeding Acute blood loss anemia Chronic macrocytic anemia Folate deficiency Presented with intermittent black stool, low hemoglobin in the setting of chronic alcohol use GI consulted.  Currently on IV Protonix Noted the plan for EGD tomorrow.  2 g sodium high-protein diet for now. Hemoglobin low at 7.9.  Baseline hemoglobin 12.1 from a year ago MCV over 100, folic acid level low at 4.8. Hemoglobin dropped to 6.3 this morning. 2 units PRBC transfusion ordered this morning.  Also started on folic acid abdomen.  Pending vitamin B12 level Continue to monitor Recent Labs    02/11/23 1742 01/28/24 1137 01/28/24 1140 01/29/24 0421  HGB 12.1  --  7.9* 6.3*  MCV 100.3*  --  107.9* 113.2*  FOLATE  --  4.8*  --   --    Early satiety Complains of sensation of fullness and early satiety.  Likely due to grossly enlarged liver.  Alcoholic liver cirrhosis Portal hypertension Ascites, varices Imagings reviewed.  Has hepatic steatosis as well as liver cirrhosis findings, portal hypertension changes with moderate ascites, esophageal varices Elevated liver enzymes.  Significantly elevated bilirubin to >11.   Ammonia level elevated mildly. Mental status intact Paracentesis may be needed Continue to trend LFTs.   Recent Labs  Lab 01/28/24 1140 01/28/24 1651 01/29/24 0421  AST 154*  --  125*  ALT 27  --  25  ALKPHOS 182*  --  153*  BILITOT 11.5*  --  12.5*  PROT 7.1  --  6.6  ALBUMIN 1.8*  --  1.6*  AMMONIA  --  49* 50*  INR 1.6*  --   --   LIPASE 27  --   --   PLT 244  --  194   Chronic alcohol use  High risk of alcohol withdrawal Was drinking 15 beers daily, recently cut  back to 2-3 8% 12 ounce beers a night. Blood alcohol level at presentation elevated to 94. Started CIWA protocol with as needed Ativan . Multivitamins  Cholelithiasis Ultrasound abdomen and CT abdomen reviewed.  Also raised suspicion of acute cholecystitis but per GI and general surgery, gallbladder wall edema is likely due to portal hypertension.   Possible right hepatic lobe mass CT abdomen also suggested a masslike enhancement in the right hepatic lobe measuring 8.2 cm.  AFP level sent.  If elevated may need MRI with GI  Persistent tachycardia Raises concern of withdrawal.  Mental status intact however.  Continue to monitor  Abnormal urinalysis Denies any urinary symptoms Urinalysis showed hazy amber color urine with positive nitrite, rare bacteria. Currently on IV Rocephin Recent Labs  Lab 01/28/24 1140 01/29/24 0421  WBC 11.7* 9.6   HypOglycemia Blood sugar level low at 63 this morning. No prior history of diabetes A1c pending Monitor fingersticks No results found for: HGBA1C No results for input(s): GLUCAP in the last 168 hours.   Hypokalemia Potassium low at 3.4.  Replacement ordered. Recent Labs  Lab 01/28/24 1140 01/29/24 0421  K 3.1* 3.4*  MG  --  1.8  PHOS  --  4.1   Hyponatremia Likely hypervolemic hyponatremia in the setting of liver cirrhosis and ascites Recent Labs  Lab 01/28/24 1140 01/29/24 0421  NA 129* 129*   Chronic smoker Smokes 1 pack/day.  Counseled to quit.  Nicotine patch offered  Constipation Reported minimal BM in last 2 weeks. CT imaging did not show significant stool burden. Continue to monitor  Mobility: Independent at baseline.  Encourage ambulation PT Orders:   PT Follow up Rec:    Goals of care:   Code Status: Full Code    DVT prophylaxis:  SCDs Start: 01/28/24 1540   Antimicrobials: IV Rocephin Fluid: Stop IV fluid.  Encourage oral hydration Consultants: GI, surgery Family Communication: None at  bedside  Status: Inpatient Level of care:  Telemetry Medical   Patient is from: Home Anticipated d/c to: Pending clinical course  Diet:  Diet Order             Diet NPO time specified Except for: Sips with Meds  Diet effective midnight           Diet 2 gram sodium Room service appropriate? Yes; Fluid consistency: Thin  Diet effective now                   Scheduled Meds:  folic acid  1 mg Oral Daily   insulin aspart  0-5 Units Subcutaneous QHS   insulin aspart  0-9 Units Subcutaneous TID WC   multivitamin with minerals  1 tablet Oral Daily   nicotine  21 mg Transdermal Daily   pantoprazole (PROTONIX) IV  40 mg Intravenous Q12H   prednisoLONE  40 mg Oral Daily   thiamine  100 mg Oral Daily   Or   thiamine  100 mg Intravenous Daily    PRN meds: albuterol, bisacodyl, dextrose, hydrALAZINE, LORazepam  **OR** LORazepam , polyethylene  glycol   Infusions:   sodium chloride 75 mL/hr at 01/29/24 1233   cefTRIAXone (ROCEPHIN)  IV      Antimicrobials: Anti-infectives (From admission, onward)    Start     Dose/Rate Route Frequency Ordered Stop   01/29/24 1600  cefTRIAXone (ROCEPHIN) 2 g in sodium chloride 0.9 % 100 mL IVPB        2 g 200 mL/hr over 30 Minutes Intravenous Every 24 hours 01/28/24 1608     01/28/24 1515  cefTRIAXone (ROCEPHIN) 2 g in sodium chloride 0.9 % 100 mL IVPB        2 g 200 mL/hr over 30 Minutes Intravenous  Once 01/28/24 1505 01/28/24 1723       Objective: Vitals:   01/29/24 1358 01/29/24 1400  BP: 99/63 107/60  Pulse: 99 (!) 101  Resp: 18   Temp: 98.3 F (36.8 C) 98.2 F (36.8 C)  SpO2:  100%    Intake/Output Summary (Last 24 hours) at 01/29/2024 1422 Last data filed at 01/29/2024 1404 Gross per 24 hour  Intake 2077.72 ml  Output 0 ml  Net 2077.72 ml   Filed Weights   01/28/24 1137  Weight: 66.2 kg   Weight change:  Body mass index is 22.2 kg/m.   Physical Exam: General exam: Pleasant, young Caucasian female Skin: No  rashes, lesions or ulcers. HEENT: Atraumatic, normocephalic, no obvious bleeding.  Conjunctival icterus bilaterally Lungs: Clear to auscultation bilaterally,  CVS: Tachycardic, S1-S2, no murmur GI/Abd: Soft, mild diffuse tenderness, bowel sound present,   CNS: Alert, awake, oriented x 3.  No tremors Psychiatry: Mood appropriate Extremities: Trace bilateral pedal edema, no calf tenderness,   Data Review: I have personally reviewed the laboratory data and studies available.  F/u labs ordered Unresulted Labs (From admission, onward)     Start     Ordered   01/30/24 0500  CBC with Differential/Platelet  Daily,   R      01/29/24 0919   01/30/24 0500  Comprehensive metabolic panel with GFR  Daily,   R      01/29/24 0919   01/30/24 0500  Protime-INR  Tomorrow morning,   R        01/29/24 1245   01/29/24 1423  Hemoglobin A1c  Add-on,   AD        01/29/24 1422   01/29/24 0917  Ferritin  (Anemia Panel (PNL))  Add-on,   AD        01/29/24 0916   01/29/24 0917  Vitamin B12  (Anemia Panel (PNL))  Add-on,   AD        01/29/24 0916   01/29/24 0500  Hepatitis B core antibody, total  Tomorrow morning,   R        01/28/24 1655   01/29/24 0500  Hepatitis B surface antigen  Tomorrow morning,   R        01/28/24 1655   01/29/24 0500  AFP tumor marker  Tomorrow morning,   R        01/28/24 1656            Signed, Chapman Rota, MD [facility] 01/29/2024

## 2024-01-29 NOTE — ED Notes (Signed)
 Blood transfusion complete. Pt up to toilet at this time. Pt reports she sharted. Fresh linen administered. Bedding changed.

## 2024-01-29 NOTE — H&P (View-Only) (Signed)
 Patient ID: Erika Morris, female   DOB: [birth year], 38 y.o.   MRN: [id number]    Progress Note   Subjective   Day # 2 CC; generalized abdominal pain//discomfort, early satiety, jaundice  CT abdomen and pelvis with contrast-significant hepatomegaly, subtle nodular contour of the liver/masslike enhancement in the right hepatic lobe measuring 8.2 cm, most likely represents focal fatty sparing nonemergent MRI with IV contrast recommended punctate radiopaque gallstone, circumferential wall edema surrounding the decompressed gallbladder no ductal dilation, portal veins patent, small paraesophageal and perigastric varices normal-appearing pancreas, mild wall thickening of the right colon likely due to underdistention  Labs-hep C antibody negative/hep B pending AFP pending Venous ammonia 50 Sodium 129/potassium 3.4/CO2 21/BUN less than 5/creatinine 0.7 Albumin 1.6 T. bili 12.5/alk phos 153/AST 125 WBC 9.6/hemoglobin 6.3/hematocrit 18.9/MCV 113.2  Discriminant function score=34  Patient says she feels a little better than yesterday, nerves okay no tremor able to tolerate some solid food today though not happy with a low-sodium diet.  Abdomen still feels full and uncomfortable, 1 very small dark/black stool today  Received 1 unit of packed RBCs this a.m. for hemoglobin 6.3    Objective   Vital signs in last 24 hours: Temp:  [97.2 F (36.2 C)-99 F (37.2 C)] 98.9 F (37.2 C) (10/21 0958) Pulse Rate:  [99-117] 116 (10/21 0800) Resp:  [14-24] 14 (10/21 0800) BP: (83-114)/(41-92) 112/68 (10/21 0800) SpO2:  [91 %-100 %] 98 % (10/21 0800) Weight:  [66.2 kg] 66.2 kg (10/20 1137) Last BM Date : 01/14/24 General:   Young white female in NAD, jaundiced Heart: Tachycardic regular rate and rhythm; no murmurs Lungs: Respirations even and unlabored, lungs CTA bilaterally Abdomen:  Soft, enlarged liver palpable across midline, bowel sounds are present, no palpable splenomegaly, no fluid  wave Extremities: 1+ edema bilateral lower extremities to shins Neurologic:  Alert and oriented,  grossly normal neurologically.  No tremor, no asterixis Psych:  Cooperative. Normal mood and affect.  Intake/Output from previous day: 10/20 0701 - 10/21 0700 In: 1262.7 [I.V.:906; IV Piggyback:356.7] Out: -  Intake/Output this shift: No intake/output data recorded.  Lab Results: Recent Labs    01/28/24 1140 01/29/24 0421  WBC 11.7* 9.6  HGB 7.9* 6.3*  HCT 23.3* 18.9*  PLT 244 194   BMET Recent Labs    01/28/24 1140 01/29/24 0421  NA 129* 129*  K 3.1* 3.4*  CL 93* 97*  CO2 21* 21*  GLUCOSE 85 63*  BUN <5* <5*  CREATININE 0.69 0.70  CALCIUM 7.9* 7.5*   LFT Recent Labs    01/29/24 0421  PROT 6.6  ALBUMIN 1.6*  AST 125*  ALT 25  ALKPHOS 153*  BILITOT 12.5*   PT/INR Recent Labs    01/28/24 1140  LABPROT 19.7*  INR 1.6*    Studies/Results: CT ABDOMEN PELVIS W CONTRAST Result Date: 01/28/2024 CLINICAL DATA:  Abdominal pain, nausea vomiting, constipation, jaundice EXAM: CT ABDOMEN AND PELVIS WITH CONTRAST TECHNIQUE: Multidetector CT imaging of the abdomen and pelvis was performed using the standard protocol following bolus administration of intravenous contrast. RADIATION DOSE REDUCTION: This exam was performed according to the departmental dose-optimization program which includes automated exposure control, adjustment of the mA and/or kV according to patient size and/or use of iterative reconstruction technique. CONTRAST:  75mL OMNIPAQUE  IOHEXOL  350 MG/ML SOLN COMPARISON:  01/28/2024 right upper quadrant ultrasound FINDINGS: Lower chest: No focal airspace consolidation or pleural effusion. Hepatobiliary: Hepatomegaly. Subtle nodular contour of the liver with irregular hypodensity throughout the liver parenchyma. There is  masslike enhancement in the right hepatic lobe (axial 17), measuring 8.2 cm.Punctate radiopaque gallstone. Circumferential wall edema surrounding the  decompressed gallbladder. No intrahepatic or extrahepatic biliary ductal dilation. The portal veins are patent. Small paraesophageal and perigastric varices. Pancreas: No mass or main ductal dilation. No peripancreatic inflammation or fluid collection. Spleen: Normal size. No mass. Adrenals/Urinary Tract: No adrenal masses. No renal mass. No nephrolithiasis or hydronephrosis. The urinary bladder is distended without focal abnormality. Stomach/Bowel: The stomach is decompressed without focal abnormality. No small bowel wall thickening or inflammation. No small bowel obstruction.Decompressed appendiceal stump. Mild wall thickening of the right colon. Vascular/Lymphatic: No aortic aneurysm. Scattered aortoiliac atherosclerosis. No intraabdominal or pelvic lymphadenopathy. Reproductive: The uterus and ovaries are within normal limits for patient's age.No free pelvic fluid. Left adnexal tubal ligation clip. The right clip is displaced layering in the rectouterine pouch. Other: No pneumoperitoneum. Moderate volume ascites predominantly in the lower abdomen and pelvis. Hazy mesenteric edema. Musculoskeletal: No acute fracture or destructive lesion. IMPRESSION: 1. Hepatomegaly. Subtle nodular contour of the liver, worrisome for cirrhosis. Irregular hypodensity throughout the liver parenchyma, most likely reflecting geographic hepatic steatosis. In the right hepatic lobe, there is masslike enhancement measuring 8.2 cm (axial 17), which most likely represents focal fatty sparing. Nonemergent multiphase abdominal MRI with IV contrast recommended to exclude underlying hepatic mass (such as HCC). 2. Small volume paraesophageal and perigastric varices with moderate volume ascites. Given the abnormal flow in the portal vein on the recent ultrasound, this constellation of findings is consistent with portal hypertension. No visualized portal vein thrombosis. 3. Circumferential wall edema involving the decompressed gallbladder. There  is a single punctate radiopaque gallstone present. While this could represent changes of acute cholecystitis, gallbladder wall edema may be related to underlying chronic liver disease hypoproteinemia. If concern persists for acute cholecystitis, a nuclear medicine hepatobiliary scan would be recommended. 4. Mild wall thickening of the right colon, likely due to underdistention and portal colopathy. Aortic Atherosclerosis (ICD10-I70.0). Electronically Signed   By: Rogelia Myers M.D.   On: 01/28/2024 16:23   US  Abdomen Limited RUQ (LIVER/GB) Result Date: 01/28/2024 CLINICAL DATA:  Abdominal pain. EXAM: ULTRASOUND ABDOMEN LIMITED RIGHT UPPER QUADRANT COMPARISON:  None Available. FINDINGS: Gallbladder: Shadowing echogenic stone measures 8 mm. Slight wall thickening, 4 mm. Positive sonographic Murphy sign. Pericholecystic fluid. Common bile duct: Diameter: 3 mm, within normal limits. No intrahepatic biliary ductal dilatation. Liver: Diffusely increased in echogenicity. Poor signal in the portal vein on Doppler imaging with limited assessment of flow direction, which may be reversed. Other: None. IMPRESSION: 1. Acute cholecystitis. 2. Poor signal in the portal vein with questionable reversal of flow, worrisome for portal vein thrombosis. 3. Hepatic steatosis. Electronically Signed   By: Newell Eke M.D.   On: 01/28/2024 14:20       Assessment / Plan:    #80  38 year old white female with history of heavy EtOH use presenting with 2-week history of abdominal discomfort, fullness and early satiety over the past several weeks, change in bowel habits with obstipation and also reports of dark/tarry stool intermittently who is seen at urgent care earlier on the day of admission noted to be jaundiced and directed to the emergency room.  Extensive workup in the ER yesterday with labs and imaging all concerning for new diagnosis of EtOH induced cirrhosis with portal hypertension, and coagulopathy.  She has a very  large liver on CT which likely explains her sensation of fullness and early satiety. In addition there is a masslike area  described on CT which may be focal fatty sparing but may require further imaging Await AFP  Maddrey discriminant function score today =34-criteria for initiation of steroids for acute EtOH hepatitis component of this picture.  #2 anemia, macrocytic with drop in hemoglobin overnight-he is not having active GI bleeding though does describe a very small dark stool this morning . Anemia likely multifactorial in setting of bone marrow suppression, possible B12 deficiency in addition to folate deficiency and component of GI blood loss-probable portal gastropathy  #3 portal hypertension with esophageal varices noted on imaging.  Plan; 2 g sodium high-protein diet N.p.o. after midnight Patient will be scheduled for EGD with Dr. Legrand tomorrow 01/30/2024.  Procedure was discussed in detail with the patient including indications risk and benefits and she is agreeable to proceed Continue to trend hemoglobin, transfuse to keep her hemoglobin above 7 Have added IV PPI twice daily for now Prednisolone 40 mg p.o. daily-recalculate Lille score in 7 days Await multiple pending labs If AFP elevated, will need MRI of the liver Patient counseled extensively today again regarding recommendation for complete alcohol abstinence forever moving forward GI will continue to follow with you    Principal Problem:   Acute GI bleeding     LOS: 1 day   Amy Esterwood PA-C 01/29/2024, 11:23 AM  I have taken an interval history, thoroughly reviewed the chart and examined the patient. I agree with the Advanced Practitioner's note, impression and recommendations, and have recorded additional findings, impressions and recommendations below.  Patient was seen along with my PA I performed a substantive portion of this encounter (>50% time spent), including a complete performance of the medical decision  making.  My additional thoughts are as follows:  Remains jaundiced and icteric. Additional data is negative hepatitis C antibody and HIV antibody.  She has positive hepatitis A antibody (indicating immunity) and a positive hepatitis B surface antibody (indicating prior exposure and clearance or vaccination-awaiting core antibody and surface antigen results)   She is alert and conversational today with no signs of encephalopathy or alcohol withdrawal Continues to have abdominal bloating and early satiety.  Recent constipation, small BM since admission.  Was describing some black stool at home, and was unclear if this may have been from something dietary or OTC medicine versus GI bleeding.  We had a long discussion about her cirrhosis diagnosis and potential indications of that with further testing needed. Ascites that does not require diuretics at present.  Multifactorial anemia with probable alcohol related marrow suppression, dietary deficiencies, awaiting B12 folic acid and iron levels.  We wonder if there may have been some recent GI bleeding contributing as well based on the history.  Her discriminant function is 33-34, though it is admittedly difficult to attribute this entirely to acute alcoholic hepatitis since her overall clinical picture suggests chronicity with cirrhosis development and most recent prehospital LFTs from November 2024. Nevertheless, given the severity of her condition and high 90-day mortality, we have decided to start prednisolone 40 mg once daily with a repeat score in 7 days.  Upper endoscopy tomorrow for evaluation of any potential GI bleeding source and evaluate for gastroesophageal varices.  She was agreeable after discussion of procedure and risks.  The benefits and risks of the planned procedure(s) were described in detail with the patient or (when appropriate) their health care proxy.  Risks were outlined as including, but not limited to, bleeding, infection,  perforation, adverse medication reaction leading to cardiac or pulmonary decompensation, pancreatitis (if ERCP).  The limitation of incomplete mucosal visualization was also discussed.  No guarantees or warranties were given.  Patient at increased risk for cardiopulmonary complications of procedure due to medical comorbidities.  _________________  This consultation required a high degree of medical decision making due to the nature and complexity of the acute condition(s) being evaluated as well as the patient's medical comorbidities.  Victory LITTIE Brand III Office:[phone]

## 2024-01-29 NOTE — Plan of Care (Signed)
 Patient admitted to floor this pm. On arrival is A&Ox4, Denies pain. One unit of PRBC transfuse on arrival to floor. No adverse reaction noted. Will follow plan of care.

## 2024-01-29 NOTE — Progress Notes (Signed)
 [facility] Progress Note     Subjective: CC:  Reports feeling slightly better compared to yesterday. Ate a small amt solid food this AM followed by having flatus and small amt watery, black stool.   Objective: Vital signs in last 24 hours: Temp:  [97.2 F (36.2 C)-99 F (37.2 C)] 98.6 F (37 C) (10/21 0543) Pulse Rate:  [99-119] 116 (10/21 0800) Resp:  [14-24] 14 (10/21 0800) BP: (83-114)/(41-92) 112/68 (10/21 0800) SpO2:  [91 %-100 %] 98 % (10/21 0800) Weight:  [66.2 kg] 66.2 kg (10/20 1137) Last BM Date : 01/14/24  Intake/Output from previous day: 10/20 0701 - 10/21 0700 In: 1262.7 [I.V.:906; IV Piggyback:356.7] Out: -  Intake/Output this shift: No intake/output data recorded.  PE: Gen:  Alert, NAD, cooperative, +jaundice Card:  sinus tachycardia  Pulm:  Normal effort Abd: Soft, mild global tenderness without peritonitis  Skin: warm and dry, no rashes  Psych: A&Ox3   Lab Results:  Recent Labs    01/28/24 1140 01/29/24 0421  WBC 11.7* 9.6  HGB 7.9* 6.3*  HCT 23.3* 18.9*  PLT 244 194   BMET Recent Labs    01/28/24 1140 01/29/24 0421  NA 129* 129*  K 3.1* 3.4*  CL 93* 97*  CO2 21* 21*  GLUCOSE 85 63*  BUN <5* <5*  CREATININE 0.69 0.70  CALCIUM 7.9* 7.5*   PT/INR Recent Labs    01/28/24 1140  LABPROT 19.7*  INR 1.6*   CMP     Component Value Date/Time   NA 129 (L) 01/29/2024 0421   K 3.4 (L) 01/29/2024 0421   CL 97 (L) 01/29/2024 0421   CO2 21 (L) 01/29/2024 0421   GLUCOSE 63 (L) 01/29/2024 0421   BUN <5 (L) 01/29/2024 0421   CREATININE 0.70 01/29/2024 0421   CALCIUM 7.5 (L) 01/29/2024 0421   PROT 6.6 01/29/2024 0421   ALBUMIN 1.6 (L) 01/29/2024 0421   AST 125 (H) 01/29/2024 0421   ALT 25 01/29/2024 0421   ALKPHOS 153 (H) 01/29/2024 0421   BILITOT 12.5 (H) 01/29/2024 0421   GFRNONAA >60 01/29/2024 0421   Lipase     Component Value Date/Time   LIPASE 27 01/28/2024 1140       Studies/Results: CT ABDOMEN PELVIS W  CONTRAST Result Date: 01/28/2024 CLINICAL DATA:  Abdominal pain, nausea vomiting, constipation, jaundice EXAM: CT ABDOMEN AND PELVIS WITH CONTRAST TECHNIQUE: Multidetector CT imaging of the abdomen and pelvis was performed using the standard protocol following bolus administration of intravenous contrast. RADIATION DOSE REDUCTION: This exam was performed according to the departmental dose-optimization program which includes automated exposure control, adjustment of the mA and/or kV according to patient size and/or use of iterative reconstruction technique. CONTRAST:  75mL OMNIPAQUE  IOHEXOL  350 MG/ML SOLN COMPARISON:  01/28/2024 right upper quadrant ultrasound FINDINGS: Lower chest: No focal airspace consolidation or pleural effusion. Hepatobiliary: Hepatomegaly. Subtle nodular contour of the liver with irregular hypodensity throughout the liver parenchyma. There is masslike enhancement in the right hepatic lobe (axial 17), measuring 8.2 cm.Punctate radiopaque gallstone. Circumferential wall edema surrounding the decompressed gallbladder. No intrahepatic or extrahepatic biliary ductal dilation. The portal veins are patent. Small paraesophageal and perigastric varices. Pancreas: No mass or main ductal dilation. No peripancreatic inflammation or fluid collection. Spleen: Normal size. No mass. Adrenals/Urinary Tract: No adrenal masses. No renal mass. No nephrolithiasis or hydronephrosis. The urinary bladder is distended without focal abnormality. Stomach/Bowel: The stomach is decompressed without focal abnormality. No small bowel wall thickening or inflammation. No small bowel  obstruction.Decompressed appendiceal stump. Mild wall thickening of the right colon. Vascular/Lymphatic: No aortic aneurysm. Scattered aortoiliac atherosclerosis. No intraabdominal or pelvic lymphadenopathy. Reproductive: The uterus and ovaries are within normal limits for patient's age.No free pelvic fluid. Left adnexal tubal ligation clip. The  right clip is displaced layering in the rectouterine pouch. Other: No pneumoperitoneum. Moderate volume ascites predominantly in the lower abdomen and pelvis. Hazy mesenteric edema. Musculoskeletal: No acute fracture or destructive lesion. IMPRESSION: 1. Hepatomegaly. Subtle nodular contour of the liver, worrisome for cirrhosis. Irregular hypodensity throughout the liver parenchyma, most likely reflecting geographic hepatic steatosis. In the right hepatic lobe, there is masslike enhancement measuring 8.2 cm (axial 17), which most likely represents focal fatty sparing. Nonemergent multiphase abdominal MRI with IV contrast recommended to exclude underlying hepatic mass (such as HCC). 2. Small volume paraesophageal and perigastric varices with moderate volume ascites. Given the abnormal flow in the portal vein on the recent ultrasound, this constellation of findings is consistent with portal hypertension. No visualized portal vein thrombosis. 3. Circumferential wall edema involving the decompressed gallbladder. There is a single punctate radiopaque gallstone present. While this could represent changes of acute cholecystitis, gallbladder wall edema may be related to underlying chronic liver disease hypoproteinemia. If concern persists for acute cholecystitis, a nuclear medicine hepatobiliary scan would be recommended. 4. Mild wall thickening of the right colon, likely due to underdistention and portal colopathy. Aortic Atherosclerosis (ICD10-I70.0). Electronically Signed   By: Rogelia Myers M.D.   On: 01/28/2024 16:23   US  Abdomen Limited RUQ (LIVER/GB) Result Date: 01/28/2024 CLINICAL DATA:  Abdominal pain. EXAM: ULTRASOUND ABDOMEN LIMITED RIGHT UPPER QUADRANT COMPARISON:  None Available. FINDINGS: Gallbladder: Shadowing echogenic stone measures 8 mm. Slight wall thickening, 4 mm. Positive sonographic Murphy sign. Pericholecystic fluid. Common bile duct: Diameter: 3 mm, within normal limits. No intrahepatic  biliary ductal dilatation. Liver: Diffusely increased in echogenicity. Poor signal in the portal vein on Doppler imaging with limited assessment of flow direction, which may be reversed. Other: None. IMPRESSION: 1. Acute cholecystitis. 2. Poor signal in the portal vein with questionable reversal of flow, worrisome for portal vein thrombosis. 3. Hepatic steatosis. Electronically Signed   By: Newell Eke M.D.   On: 01/28/2024 14:20    Anti-infectives: Anti-infectives (From admission, onward)    Start     Dose/Rate Route Frequency Ordered Stop   01/29/24 1600  cefTRIAXone (ROCEPHIN) 2 g in sodium chloride 0.9 % 100 mL IVPB        2 g 200 mL/hr over 30 Minutes Intravenous Every 24 hours 01/28/24 1608     01/28/24 1515  cefTRIAXone (ROCEPHIN) 2 g in sodium chloride 0.9 % 100 mL IVPB        2 g 200 mL/hr over 30 Minutes Intravenous  Once 01/28/24 1505 01/28/24 1723         Latest Ref Rng & Units 01/29/2024    4:21 AM 01/28/2024   11:40 AM 02/11/2023    5:42 PM  Hepatic Function  Total Protein 6.5 - 8.1 g/dL 6.6  7.1  7.5   Albumin 3.5 - 5.0 g/dL 1.6  1.8  4.2   AST 15 - 41 U/L 125  154  74   ALT 0 - 44 U/L 25  27  29    Alk Phosphatase 38 - 126 U/L 153  182  105   Total Bilirubin 0.0 - 1.2 mg/dL 87.4  88.4  0.5       Assessment/Plan 38 year old female with 2 weeks of progressive abdominal  distention who now presents with jaundice and melena. Bilirubin is 12 today. Hgb down to 6.3 and she received 1 u pRBC. She has CT evidence of hepatic irregularity and enlargement with esophageal and gastric varices as well as moderate ascites. While she does have a small gallstone, her symptoms are more concerning for acute liver disease and portal hypertension with UGI bleed. No role for cholecystectomy at present. Further imaging and workup per GI service. Please call our service back as needed.     LOS: 1 day   I reviewed nursing notes, Consultant GI notes, hospitalist notes, last 24 h vitals  and pain scores, last 48 h intake and output, last 24 h labs and trends, and last 24 h imaging results.  This care required moderate level of medical decision making.   Almarie Pringle, PA-C [facility] Please see Amion for pager number during day hours 7:00am-4:30pm

## 2024-01-30 ENCOUNTER — Encounter (HOSPITAL_COMMUNITY): Payer: Self-pay | Admitting: Internal Medicine

## 2024-01-30 ENCOUNTER — Inpatient Hospital Stay (HOSPITAL_COMMUNITY): Payer: MEDICAID

## 2024-01-30 ENCOUNTER — Encounter (HOSPITAL_COMMUNITY): Admission: EM | Disposition: A | Payer: Self-pay | Source: Home / Self Care | Attending: Internal Medicine

## 2024-01-30 DIAGNOSIS — D539 Nutritional anemia, unspecified: Secondary | ICD-10-CM

## 2024-01-30 DIAGNOSIS — K7031 Alcoholic cirrhosis of liver with ascites: Secondary | ICD-10-CM

## 2024-01-30 DIAGNOSIS — K3189 Other diseases of stomach and duodenum: Secondary | ICD-10-CM

## 2024-01-30 DIAGNOSIS — K449 Diaphragmatic hernia without obstruction or gangrene: Secondary | ICD-10-CM

## 2024-01-30 HISTORY — PX: ESOPHAGOGASTRODUODENOSCOPY: SHX5428

## 2024-01-30 LAB — CBC WITH DIFFERENTIAL/PLATELET
Abs Immature Granulocytes: 0.04 K/uL (ref 0.00–0.07)
Basophils Absolute: 0 K/uL (ref 0.0–0.1)
Basophils Relative: 0 %
Eosinophils Absolute: 0 K/uL (ref 0.0–0.5)
Eosinophils Relative: 0 %
HCT: 26.9 % — ABNORMAL LOW (ref 36.0–46.0)
Hemoglobin: 9.2 g/dL — ABNORMAL LOW (ref 12.0–15.0)
Immature Granulocytes: 1 %
Lymphocytes Relative: 13 %
Lymphs Abs: 1.1 K/uL (ref 0.7–4.0)
MCH: 35.4 pg — ABNORMAL HIGH (ref 26.0–34.0)
MCHC: 34.2 g/dL (ref 30.0–36.0)
MCV: 103.5 fL — ABNORMAL HIGH (ref 80.0–100.0)
Monocytes Absolute: 0.3 K/uL (ref 0.1–1.0)
Monocytes Relative: 4 %
Neutro Abs: 6.6 K/uL (ref 1.7–7.7)
Neutrophils Relative %: 82 %
Platelets: 180 K/uL (ref 150–400)
RBC: 2.6 MIL/uL — ABNORMAL LOW (ref 3.87–5.11)
RDW: 20 % — ABNORMAL HIGH (ref 11.5–15.5)
WBC: 7.9 K/uL (ref 4.0–10.5)
nRBC: 0 % (ref 0.0–0.2)

## 2024-01-30 LAB — BPAM RBC
Blood Product Expiration Date: 202511132359
Blood Product Unit Number: 202511132359
ISSUE DATE / TIME: 202510210517
PRODUCT CODE: 202510211347
PRODUCT CODE: 202511132359
Unit Type and Rh: 600
Unit Type and Rh: 202511132359
Unit Type and Rh: 600
Unit Type and Rh: 600

## 2024-01-30 LAB — TYPE AND SCREEN
ABO/RH(D): A NEG
Antibody Screen: POSITIVE
Unit division: 0
Unit division: 0

## 2024-01-30 LAB — COMPREHENSIVE METABOLIC PANEL WITH GFR
ALT: 25 U/L (ref 0–44)
AST: 114 U/L — ABNORMAL HIGH (ref 15–41)
Albumin: 1.6 g/dL — ABNORMAL LOW (ref 3.5–5.0)
Alkaline Phosphatase: 154 U/L — ABNORMAL HIGH (ref 38–126)
Anion gap: 8 (ref 5–15)
BUN: <5 mg/dL — ABNORMAL LOW (ref 6–20)
CO2: 21 mmol/L — ABNORMAL LOW (ref 22–32)
Calcium: 7.9 mg/dL — ABNORMAL LOW (ref 8.9–10.3)
Chloride: 100 mmol/L (ref 98–111)
Creatinine, Ser: 0.57 mg/dL (ref 0.44–1.00)
GFR, Estimated: >60 mL/min (ref >60)
Glucose, Bld: 122 mg/dL — ABNORMAL HIGH (ref 70–99)
Potassium: 4.3 mmol/L (ref 3.5–5.1)
Sodium: 129 mmol/L — ABNORMAL LOW (ref 135–145)
Total Bilirubin: 11.9 mg/dL — ABNORMAL HIGH (ref 0.0–1.2)
Total Protein: 6.7 g/dL (ref 6.5–8.1)

## 2024-01-30 LAB — GLUCOSE, CAPILLARY
Glucose-Capillary: 119 mg/dL — ABNORMAL HIGH (ref 70–99)
Glucose-Capillary: 98 mg/dL (ref 70–99)
Glucose-Capillary: 84 mg/dL (ref 70–99)
Glucose-Capillary: 123 mg/dL — ABNORMAL HIGH (ref 70–99)
Glucose-Capillary: 152 mg/dL — ABNORMAL HIGH (ref 70–99)

## 2024-01-30 LAB — HEPATITIS B SURFACE ANTIGEN: Hepatitis B Surface Ag: NONREACTIVE

## 2024-01-30 LAB — AFP TUMOR MARKER: AFP, Serum, Tumor Marker: 4.1 ng/mL (ref 0.0–6.4)

## 2024-01-30 LAB — PROTIME-INR
INR: 1.9 — ABNORMAL HIGH (ref 0.8–1.2)
Prothrombin Time: 22.7 s — ABNORMAL HIGH (ref 11.4–15.2)

## 2024-01-30 LAB — HEPATITIS B CORE ANTIBODY, TOTAL: HEP B CORE AB: NEGATIVE

## 2024-01-30 SURGERY — EGD (ESOPHAGOGASTRODUODENOSCOPY)
Anesthesia: Monitor Anesthesia Care

## 2024-01-30 MED ORDER — SODIUM CHLORIDE 0.9 % IV SOLN
INTRAVENOUS | Status: AC | PRN
  Administered 2024-01-30: 500 mL via INTRAMUSCULAR

## 2024-01-30 MED ORDER — SPIRONOLACTONE 25 MG PO TABS
50.0000 mg | ORAL_TABLET | Freq: Every day | ORAL | Status: DC
  Administered 2024-01-31: 50 mg via ORAL
  Filled 2024-01-30: qty 2

## 2024-01-30 MED ORDER — ACETAMINOPHEN 325 MG PO TABS
650.0000 mg | ORAL_TABLET | Freq: Once | ORAL | Status: AC
Start: 2024-01-30 — End: 2024-01-30
  Administered 2024-01-30: 650 mg via ORAL
  Filled 2024-01-30: qty 2

## 2024-01-30 MED ORDER — PROPOFOL 10 MG/ML IV BOLUS
INTRAVENOUS | Status: DC | PRN
  Administered 2024-01-30: 70 mg via INTRAVENOUS
  Administered 2024-01-30: 80 mg via INTRAVENOUS
  Administered 2024-01-30: 50 mg via INTRAVENOUS

## 2024-01-30 MED ORDER — PROPOFOL 500 MG/50ML IV EMUL
INTRAVENOUS | Status: DC | PRN
  Administered 2024-01-30: 150 ug/kg/min via INTRAVENOUS

## 2024-01-30 MED ORDER — LIDOCAINE 2% (20 MG/ML) 5 ML SYRINGE
INTRAMUSCULAR | Status: DC | PRN
  Administered 2024-01-30: 60 mg via INTRAVENOUS

## 2024-01-30 MED ORDER — FUROSEMIDE 20 MG PO TABS
20.0000 mg | ORAL_TABLET | Freq: Every day | ORAL | Status: DC
Start: 2024-01-31 — End: 2024-01-31
  Administered 2024-01-31: 20 mg via ORAL
  Filled 2024-01-30: qty 1

## 2024-01-30 NOTE — Anesthesia Preprocedure Evaluation (Signed)
 Anesthesia Evaluation  Patient identified by MRN, date of birth, ID band Patient awake    Reviewed: Allergy & Precautions, NPO status , Patient's Chart, lab work & pertinent test results  Airway Mallampati: I  TM Distance: >3 FB Neck ROM: Full    Dental  (+) Teeth Intact, Dental Advisory Given   Pulmonary Current Smoker and Patient abstained from smoking.   breath sounds clear to auscultation       Cardiovascular negative cardio ROS  Rhythm:Regular Rate:Normal     Neuro/Psych negative neurological ROS  negative psych ROS   GI/Hepatic negative GI ROS, Neg liver ROS,,,  Endo/Other  negative endocrine ROS    Renal/GU negative Renal ROS     Musculoskeletal negative musculoskeletal ROS (+)    Abdominal   Peds  Hematology negative hematology ROS (+)   Anesthesia Other Findings   Reproductive/Obstetrics                              Anesthesia Physical Anesthesia Plan  ASA: 2  Anesthesia Plan: MAC   Post-op Pain Management: Minimal or no pain anticipated   Induction: Intravenous  PONV Risk Score and Plan: 0 and Propofol infusion  Airway Management Planned: Natural Airway and Nasal Cannula  Additional Equipment: None  Intra-op Plan:   Post-operative Plan:   Informed Consent: I have reviewed the patients History and Physical, chart, labs and discussed the procedure including the risks, benefits and alternatives for the proposed anesthesia with the patient or authorized representative who has indicated his/her understanding and acceptance.       Plan Discussed with: CRNA  Anesthesia Plan Comments:          Anesthesia Quick Evaluation

## 2024-01-30 NOTE — TOC CAGE-AID Note (Signed)
 Transition of Care [facility]) - CAGE-AID Screening   Patient Details  Name: Erika Morris MRN: [id number] Date of Birth: [birth year]  Transition of Care [facility]) CM/SW Contact:    Tom-Johnson, Harvest Muskrat, RN Phone Number: 01/30/2024, 3:16 PM   Clinical Narrative:  Screening done, patient states she plans on not drinking or smoking cigarette again. Transport planner, resources on AVS.  CAGE-AID Screening:    Have You Ever Felt You Ought to Cut Down on Your Drinking or Drug Use?: Yes Have People Annoyed You By [employer] Your Drinking Or Drug Use?: Yes Have You Felt Bad Or Guilty About Your Drinking Or Drug Use?: No Have You Ever Had a Drink or Used Drugs First Thing In The Morning to Steady Your Nerves or to Get Rid of a Hangover?: Yes CAGE-AID Score: 3  Substance Abuse Education Offered: Yes  Substance abuse interventions: Transport planner (On AVS)

## 2024-01-30 NOTE — Progress Notes (Signed)
 PROGRESS NOTE  Erika Morris  DOB: 07/07/1985  PCP: Patient, No Pcp Per FMW:[id number]  DOA: 01/28/2024  LOS: 2 days  Hospital Day: 3   Subjective: Patient was seen and examined this afternoon.  Underwent EGD earlier today.  Going down for paracentesis now Afebrile, heart rate in 90s, blood pressure in low 100s, breathing on room air Labs from this morning with sodium 129, bilirubin 11.9, hemoglobin 9.2   Brief narrative: Erika Morris is a 38 y.o. female with chronic alcoholism, no known liver issues, chronic smoking. Was drinking 15 beers daily, recently cut back to 2-3 8% 12 ounce beers a night. Smokes 1 pack/day.  Denies any drug use. Lives at home with her husband and 2 kids. Works as a Chief Financial Officer.  10/20, patient presented to the ED with multiple complaints.  For the last several days, has noticed progressive abdominal distention, jaundice, shortness of breath.  States she bruises easily. Reports abdominal pain, nausea, vomiting for 2 weeks.  Her last normal bowel movement was 2 weeks ago.  In the last 2 weeks, she has had couple of loose stools, 2-3 of them were black.  In the ED, afebrile, tachycardic to 110s, blood pressure in low normal range, breathing room air. Labs with WBC count 11.7, hemoglobin 7.9, platelets 244, sodium 129, potassium 3.1, BUN/creatinine < 5/0.6, albumin 1.8, AST 154, alk phos 182, total bili elevated 11.5 Blood alcohol level elevated at 94 Urinalysis showed hazy amber color urine with positive nitrite, rare bacteria Abdomen ultrasound showed Acute cholecystitis, Hepatic steatosis, Questionable portal vein thrombosis CT abdomen and pelvis - Hepatomegaly, early liver cirrhosis - Small volume paraesophageal and perigastric varices with moderate volume ascites.  Findings consistent with portal hypertension. - No visualized portal vein thrombosis. - Gallbladder wall edema, single gallstone -suspected acute cystitis - mild wall thickening of the right  colon, likely due to underdistention and portal colopathy.   Patient was given IV Protonix, IV Rocephin EDP spoke with [facility], general surgery Admitted to [facility]  10/22, EGD showed small hiatal hernia, portal hypertensive gastropathy, congested duodenal mucosa, no varices seen.  Assessment/Plan: Acute GI bleeding Acute blood loss anemia Chronic macrocytic anemia Folate deficiency Presented with intermittent black stool, low hemoglobin in the setting of chronic alcohol use GI consulted.   10/22, EGD showed small hiatal hernia, portal hypertensive gastropathy, congested duodenal mucosa, no varices seen. GI recommended low-sodium diet, Aldactone 50 mg daily and Lasix 40 mg once daily.  No need for beta-blocker in the absence of varices Continue PPI Hemoglobin lowest at 6.3 on 10/21.  2 units PRBC transfused. Folic acid level low also started on folic acid supplementation.  Vitamin B12 levels adequate at 720. Continue to monitor Recent Labs    02/11/23 1742 01/28/24 1137 01/28/24 1140 01/29/24 0421 01/29/24 1324 01/30/24 0529  HGB 12.1  --  7.9* 6.3*  --  9.2*  MCV 100.3*  --  107.9* 113.2*  --  103.5*  VITAMINB12  --   --   --   --  720  --   FOLATE  --  4.8*  --   --   --   --   FERRITIN  --   --   --   --  245  --    Early satiety Complains of sensation of fullness and early satiety.  Likely due to grossly enlarged liver. Currently able to tolerate regular consistency diet  Alcoholic hepatitis  Alcoholic liver cirrhosis Portal hypertension Ascites, varices Imagings reviewed.  Has hepatic  steatosis as well as liver cirrhosis findings, portal hypertension changes with moderate ascites, esophageal varices.  Has grossly enlarged liver probably because of alcohol hepatitis. Elevated liver enzymes.  Significantly elevated bilirubin to >11.   Ammonia level was elevated mildly. Mental status intact She has started the patient on prednisolone 40 mg daily. Paracentesis ordered   Continue to trend LFTs.   Recent Labs  Lab 01/28/24 1140 01/28/24 1651 01/29/24 0421 01/30/24 0529  AST 154*  --  125* 114*  ALT 27  --  25 25  ALKPHOS 182*  --  153* 154*  BILITOT 11.5*  --  12.5* 11.9*  PROT 7.1  --  6.6 6.7  ALBUMIN 1.8*  --  1.6* 1.6*  AMMONIA  --  49* 50*  --   INR 1.6*  --   --  1.9*  LIPASE 27  --   --   --   PLT 244  --  194 180   Chronic alcohol use  High risk of alcohol withdrawal Was drinking 15 beers daily, recently cut back to 2-3 8% 12 ounce beers a night. Blood alcohol level at presentation elevated to 94. Currently on CIWA protocol with as needed Ativan . Multivitamins  Cholelithiasis Ultrasound abdomen and CT abdomen reviewed.  Also raised suspicion of acute cholecystitis but per GI and general surgery, gallbladder wall edema is likely due to portal hypertension.   Possible right hepatic lobe mass CT abdomen also suggested a masslike enhancement in the right hepatic lobe measuring 8.2 cm.  AFP level sent, not elevated  Abnormal urinalysis Denies any urinary symptoms Urinalysis showed hazy amber color urine with positive nitrite, rare bacteria. Currently on IV Rocephin Recent Labs  Lab 01/28/24 1140 01/29/24 0421 01/30/24 0529  WBC 11.7* 9.6 7.9   HypOglycemia Blood sugar level low at 63 on 10/20 No prior history of diabetes A1c pending Monitor fingersticks No results found for: HGBA1C Recent Labs  Lab 01/29/24 1634 01/29/24 2048 01/30/24 0816 01/30/24 1112 01/30/24 1401  GLUCAP 110* 148* 119* 98 84   Hypokalemia Potassium level improved with replacement. Recent Labs  Lab 01/28/24 1140 01/29/24 0421 01/30/24 0529  K 3.1* 3.4* 4.3  MG  --  1.8  --   PHOS  --  4.1  --    Hyponatremia Likely hypervolemic hyponatremia in the setting of liver cirrhosis and ascites Recent Labs  Lab 01/28/24 1140 01/29/24 0421 01/30/24 0529  NA 129* 129* 129*   Chronic smoker Smokes 1 pack/day.  Counseled to quit.  Nicotine  patch offered  Constipation Reported minimal BM in last 2 weeks. CT imaging did not show significant stool burden. Continue to monitor  Mobility: Independent at baseline.  Encourage ambulation PT Orders:   PT Follow up Rec:    Goals of care:   Code Status: Full Code    DVT prophylaxis:  SCDs Start: 01/28/24 1540   Antimicrobials: IV Rocephin Fluid: Encourage oral hydration. Consultants: GI, surgery Family Communication: None at bedside  Status: Inpatient Level of care:  Telemetry Medical   Patient is from: Home Anticipated d/c to: Ongoing GI workup.  Ultimately home  Diet:  Diet Order             Diet 2 gram sodium Room service appropriate? Yes; Fluid consistency: Thin  Diet effective now                   Scheduled Meds:  folic acid  1 mg Oral Daily   insulin aspart  0-5  Units Subcutaneous QHS   insulin aspart  0-9 Units Subcutaneous TID WC   multivitamin with minerals  1 tablet Oral Daily   nicotine  21 mg Transdermal Daily   pantoprazole (PROTONIX) IV  40 mg Intravenous Q12H   prednisoLONE  40 mg Oral Daily   thiamine  100 mg Oral Daily   Or   thiamine  100 mg Intravenous Daily    PRN meds: albuterol, bisacodyl, dextrose, hydrALAZINE, LORazepam  **OR** LORazepam , polyethylene glycol   Infusions:   sodium chloride 75 mL/hr at 01/30/24 1302   cefTRIAXone (ROCEPHIN)  IV 2 g (01/29/24 1705)    Antimicrobials: Anti-infectives (From admission, onward)    Start     Dose/Rate Route Frequency Ordered Stop   01/29/24 1600  cefTRIAXone (ROCEPHIN) 2 g in sodium chloride 0.9 % 100 mL IVPB        2 g 200 mL/hr over 30 Minutes Intravenous Every 24 hours 01/28/24 1608     01/28/24 1515  cefTRIAXone (ROCEPHIN) 2 g in sodium chloride 0.9 % 100 mL IVPB        2 g 200 mL/hr over 30 Minutes Intravenous  Once 01/28/24 1505 01/28/24 1723       Objective: Vitals:   01/30/24 1340 01/30/24 1357  BP: 101/65 99/69  Pulse: 88 94  Resp: 16 18  Temp:  98.1 F  (36.7 C)  SpO2: 96% 97%    Intake/Output Summary (Last 24 hours) at 01/30/2024 1420 Last data filed at 01/30/2024 1320 Gross per 24 hour  Intake 2055 ml  Output 0 ml  Net 2055 ml   Filed Weights   01/28/24 1137 01/30/24 1243  Weight: 66.2 kg 66.2 kg   Weight change:  Body mass index is 22.2 kg/m.   Physical Exam: General exam: Pleasant, young Caucasian female Skin: No rashes, lesions or ulcers. HEENT: Atraumatic, normocephalic, no obvious bleeding.  Conjunctival icterus bilaterally Lungs: Clear to auscultation bilaterally,  CVS:  S1-S2, no murmur GI/Abd: Soft, mild tenderness, bowel sound present,   CNS: Alert, awake, oriented x 3.  No tremors Psychiatry: Mood appropriate Extremities: Trace bilateral pedal edema, no calf tenderness,   Data Review: I have personally reviewed the laboratory data and studies available.  F/u labs ordered Unresulted Labs (From admission, onward)     Start     Ordered   01/30/24 0500  CBC with Differential/Platelet  Daily,   R      01/29/24 0919   01/30/24 0500  Comprehensive metabolic panel with GFR  Daily,   R      01/29/24 0919   01/29/24 1423  Hemoglobin A1c  Add-on,   AD        01/29/24 1422            Signed, Chapman Rota, MD [facility] 01/30/2024

## 2024-01-30 NOTE — TOC CM/SW Note (Signed)
 Transition of Care [facility]) - Inpatient Brief Assessment   Patient Details  Name: Erika Morris MRN: [id number] Date of Birth: 01-13-1986  Transition of Care [facility]) CM/SW Contact:    Tom-Johnson, Harvest Muskrat, RN Phone Number: 01/30/2024, 3:19 PM   Clinical Narrative:  Patient presented to the ED with progressive Abdominal distention, Jaundice, Shortness of Breath, N/V and black tarry stools.   Labs showed WBC 11.7, hgb 7.9, total Bilirubin elevated 11.5, Blood Alcohol level elevated at 94.  Abdomen ultrasound showed Acute Cholecystitis, Hepatic Steatosis, Questionable Portal Vein Thrombosis. CT Abdomen/Pelvis showed Hepatomegaly, early Liver Cirrhosis, Acute Cystitis, Gallbladder Wall Edema. Currently on IV Protonix, IV abx. GI following. Patient had scheduled therapeutic Paracentesis today, minimal Peritoneal fluid with numerous mobile loops of bowel noted. There was insufficient pocket of fluid to safely perform a paracentesis, procedure canceled by IR.    CM spoke with patient at bedside, patient states she lives with her husband and two children. Has two supportive sisters, mother lives in [place] . Independent with care, not currently employed. Does not have a PCP, new patient establishment will be scheduled at discharge.   Patient not Medically ready for discharge.  CM will continue to follow as patient progresses with care towards discharge.        Transition of Care Asessment: Insurance and Status: Insurance coverage has been reviewed (No Insurance) Patient has primary care physician: No (New patient establishment will be scheduled at discharge.) Home environment has been reviewed: Yes Prior level of function:: Modified Independent Prior/Current Home Services: No current home services Social Drivers of Health Review: SDOH reviewed no interventions necessary Readmission risk has been reviewed: Yes Transition of care needs: transition of care needs identified, TOC will  continue to follow

## 2024-01-30 NOTE — Plan of Care (Signed)
   Problem: Education: Goal: Knowledge of General Education information will improve Description Including pain rating scale, medication(s)/side effects and non-pharmacologic comfort measures Outcome: Progressing

## 2024-01-30 NOTE — Op Note (Signed)
 [facility] Patient Name: Erika Morris Procedure Date : 01/30/2024 MRN: [id number] Attending MD: Victory CROME. Legrand , MD, [id number] Date of Birth: 1985-05-21 CSN: [id number] Age: 38 Admit Type: Inpatient Procedure:                Upper GI endoscopy Indications:              Cirrhosis rule out esophageal varices, Macrocytic                            anemia (folate deficient), reported black stool at                            home Providers:                Victory L. Legrand, MD, Robie Breed, RN,                            Coye Bade, Technician Referring MD:             [facility] Medicines:                Monitored Anesthesia Care Complications:            No immediate complications. Estimated Blood Loss:      Procedure:                Pre-Anesthesia Assessment:                           - Prior to the procedure, a History and Physical                            was performed, and patient medications and                            allergies were reviewed. The patient's tolerance of                            previous anesthesia was also reviewed. The risks                            and benefits of the procedure and the sedation                            options and risks were discussed with the patient.                            All questions were answered, and informed consent                            was obtained. Prior Anticoagulants: The patient has                            taken no anticoagulant or antiplatelet agents. ASA                            Grade Assessment:  III - A patient with severe                            systemic disease. After reviewing the risks and                            benefits, the patient was deemed in satisfactory                            condition to undergo the procedure.                           After obtaining informed consent, the endoscope was                            passed under direct vision.  Throughout the                            procedure, the patient's blood pressure, pulse, and                            oxygen saturations were monitored continuously. The                            [id number]-H190 ([id number]) Olympus endoscope was introduced                            through the mouth, and advanced to the second part                            of duodenum. The upper GI endoscopy was                            accomplished without difficulty. The patient                            tolerated the procedure well. Scope In: Scope Out: Findings:      A small hiatal hernia was present.No mucosal abnormalities or varices       seen in the esophagus.      Mild portal hypertensive gastropathy was found in the entire examined       stomach.      The exam of the stomach was otherwise normal.      The cardia and gastric fundus were normal on retroflexion. No gastric       varices seen.      Diffuse congested mucosa without active bleeding and with no stigmata of       bleeding was found in the entire duodenum. Likely due to portal HTN and       INR is 1.9, so not biopsied.      The exam of the duodenum was otherwise normal. Impression:               - Small hiatal hernia.                           -  Portal hypertensive gastropathy.                           - Congested duodenal mucosa.                           - No specimens collected. Recommendation:           - Return patient to hospital ward for ongoing care.                           - Low sodium diet.                           - Begin spironolactone 50 mg once daily and                            furosemide 40 mg once daily.                           No varices seen, so no beta blocker needed. Procedure Code(s):        --- Professional ---                           [phone], Esophagogastroduodenoscopy, flexible,                            transoral; diagnostic, including collection of                            specimen(s) by  brushing or washing, when performed                            (separate procedure) Diagnosis Code(s):        --- Professional ---                           K44.9, Diaphragmatic hernia without obstruction or                            gangrene                           K76.6, Portal hypertension                           K31.89, Other diseases of stomach and duodenum                           K74.60, Unspecified cirrhosis of liver CPT copyright 2022 American Medical Association. All rights reserved. The codes documented in this report are preliminary and upon coder review may  be revised to meet current compliance requirements. Steffan Caniglia L. Legrand, MD 01/30/2024 1:24:40 PM This report has been signed electronically. Number of Addenda: 0

## 2024-01-30 NOTE — Plan of Care (Signed)
   Problem: Education: Goal: Ability to describe self-care measures that may prevent or decrease complications (Diabetes Survival Skills Education) will improve Outcome: Completed/Met

## 2024-01-30 NOTE — Procedures (Signed)
 Patient presents for therapeutic paracentesis. I was present during limited US  of the abdomen to evaluate for ascites. There is minimal peritoneal fluid with numerous mobile loops of bowel. Insufficient pocket of fluid to safely perform a paracentesis.   After discussion of the risks versus the benefits of the procedure the patient elected to defer the at this time.  Procedure not performed.  Electronically Signed: Courtnie Brenes M Dashanti Burr, PA-C 01/30/2024, 2:42 PM

## 2024-01-30 NOTE — Transfer of Care (Signed)
 Immediate Anesthesia Transfer of Care Note  Patient: Erika Morris  Procedure(s) Performed: EGD (ESOPHAGOGASTRODUODENOSCOPY)  Patient Location: PACU  Anesthesia Type:MAC  Level of Consciousness: awake and drowsy  Airway & Oxygen Therapy: Patient Spontanous Breathing  Post-op Assessment: Report given to RN, Post -op Vital signs reviewed and stable, and Patient moving all extremities  Post vital signs: Reviewed and stable  Last Vitals:  Vitals Value Taken Time  BP 90/60 01/30/24    1320  Temp    Pulse 97 01/30/24 13:20  Resp 17 01/30/24 13:20  SpO2 97 % 01/30/24 13:20  Vitals shown include unfiled device data.  Last Pain:  Vitals:   01/30/24 1243  TempSrc: Temporal  PainSc: 0-No pain         Complications: No notable events documented.

## 2024-01-30 NOTE — Anesthesia Postprocedure Evaluation (Signed)
 Anesthesia Post Note  Patient: Erika Morris  Procedure(s) Performed: EGD (ESOPHAGOGASTRODUODENOSCOPY)     Patient location during evaluation: PACU Anesthesia Type: MAC Level of consciousness: awake and alert Pain management: pain level controlled Vital Signs Assessment: post-procedure vital signs reviewed and stable Respiratory status: spontaneous breathing, nonlabored ventilation, respiratory function stable and patient connected to nasal cannula oxygen Cardiovascular status: stable and blood pressure returned to baseline Postop Assessment: no apparent nausea or vomiting Anesthetic complications: no   No notable events documented.  Last Vitals:  Vitals:   01/30/24 1340 01/30/24 1357  BP: 101/65 99/69  Pulse: 88 94  Resp: 16 18  Temp:  36.7 C  SpO2: 96% 97%    Last Pain:  Vitals:   01/30/24 1357  TempSrc: Oral  PainSc:                  Namari Breton D Nevyn Bossman

## 2024-01-30 NOTE — Interval H&P Note (Signed)
 History and Physical Interval Note:  01/30/2024 12:58 PM  Erika Morris  has presented today for surgery, with the diagnosis of Anemia, intermittent melena, new diagnosis cirrhosis.  The various methods of treatment have been discussed with the patient and family. After consideration of risks, benefits and other options for treatment, the patient has consented to  Procedure(s): EGD (ESOPHAGOGASTRODUODENOSCOPY) (N/A) as a surgical intervention.  The patient's history has been reviewed, patient examined, no change in status, stable for surgery.  I have reviewed the patient's chart and labs.  Questions were answered to the patient's satisfaction.    Clinically stable today.  Hgb improved after Tfx PRBCs yesterday.   INR up from 1.6 to 1.9    LFTs stable (T bili somewhat improved) No encephalopathy Proceed with EGD today   Victory LITTIE Brand III

## 2024-01-31 ENCOUNTER — Other Ambulatory Visit (HOSPITAL_COMMUNITY): Payer: Self-pay

## 2024-01-31 DIAGNOSIS — K769 Liver disease, unspecified: Secondary | ICD-10-CM

## 2024-01-31 DIAGNOSIS — K7031 Alcoholic cirrhosis of liver with ascites: Secondary | ICD-10-CM

## 2024-01-31 LAB — COMPREHENSIVE METABOLIC PANEL WITH GFR
ALT: 26 U/L (ref 0–44)
AST: 113 U/L — ABNORMAL HIGH (ref 15–41)
Albumin: 1.6 g/dL — ABNORMAL LOW (ref 3.5–5.0)
Alkaline Phosphatase: 151 U/L — ABNORMAL HIGH (ref 38–126)
Anion gap: 8 (ref 5–15)
BUN: 7 mg/dL (ref 6–20)
CO2: 22 mmol/L (ref 22–32)
Calcium: 7.9 mg/dL — ABNORMAL LOW (ref 8.9–10.3)
Chloride: 100 mmol/L (ref 98–111)
Creatinine, Ser: 0.65 mg/dL (ref 0.44–1.00)
GFR, Estimated: >60 mL/min (ref >60)
Glucose, Bld: 133 mg/dL — ABNORMAL HIGH (ref 70–99)
Potassium: 4.9 mmol/L (ref 3.5–5.1)
Sodium: 130 mmol/L — ABNORMAL LOW (ref 135–145)
Total Bilirubin: 9.3 mg/dL — ABNORMAL HIGH (ref 0.0–1.2)
Total Protein: 6.5 g/dL (ref 6.5–8.1)

## 2024-01-31 LAB — CBC WITH DIFFERENTIAL/PLATELET
Abs Immature Granulocytes: 0.07 K/uL (ref 0.00–0.07)
Basophils Absolute: 0 K/uL (ref 0.0–0.1)
Basophils Relative: 0 %
Eosinophils Absolute: 0 K/uL (ref 0.0–0.5)
Eosinophils Relative: 0 %
HCT: 25.1 % — ABNORMAL LOW (ref 36.0–46.0)
Hemoglobin: 8.5 g/dL — ABNORMAL LOW (ref 12.0–15.0)
Immature Granulocytes: 1 %
Lymphocytes Relative: 15 %
Lymphs Abs: 1.4 K/uL (ref 0.7–4.0)
MCH: 35.6 pg — ABNORMAL HIGH (ref 26.0–34.0)
MCHC: 33.9 g/dL (ref 30.0–36.0)
MCV: 105 fL — ABNORMAL HIGH (ref 80.0–100.0)
Monocytes Absolute: 0.5 K/uL (ref 0.1–1.0)
Monocytes Relative: 5 %
Neutro Abs: 7.7 K/uL (ref 1.7–7.7)
Neutrophils Relative %: 79 %
Platelets: 196 K/uL (ref 150–400)
RBC: 2.39 MIL/uL — ABNORMAL LOW (ref 3.87–5.11)
RDW: 19.5 % — ABNORMAL HIGH (ref 11.5–15.5)
WBC: 9.7 K/uL (ref 4.0–10.5)
nRBC: 0 % (ref 0.0–0.2)

## 2024-01-31 LAB — HEMOGLOBIN A1C
Hgb A1c MFr Bld: 4.6 % — ABNORMAL LOW (ref 4.8–5.6)
Mean Plasma Glucose: 85 mg/dL

## 2024-01-31 LAB — GLUCOSE, CAPILLARY: Glucose-Capillary: 120 mg/dL — ABNORMAL HIGH (ref 70–99)

## 2024-01-31 MED ORDER — FOLIC ACID 1 MG PO TABS: 1.0000 mg | ORAL_TABLET | Freq: Every day | ORAL | 2 refills | Status: DC

## 2024-01-31 MED ORDER — THIAMINE HCL 100 MG PO TABS
100.0000 mg | ORAL_TABLET | Freq: Every day | ORAL | 2 refills | Status: DC
  Filled 2024-01-31: qty 30, 30d supply, fill #0

## 2024-01-31 MED ORDER — PREDNISONE 20 MG PO TABS
ORAL_TABLET | ORAL | 0 refills | Status: AC
  Filled 2024-01-31: qty 43, 29d supply, fill #0

## 2024-01-31 MED ORDER — PREDNISONE 20 MG PO TABS: 40.0000 mg | ORAL_TABLET | Freq: Every day | ORAL | 0 refills | Status: DC

## 2024-01-31 MED ORDER — PANTOPRAZOLE SODIUM 40 MG PO TBEC: 40.0000 mg | DELAYED_RELEASE_TABLET | Freq: Every day | ORAL | 1 refills | Status: DC

## 2024-01-31 MED ORDER — FOLIC ACID 1 MG PO TABS
1.0000 mg | ORAL_TABLET | Freq: Every day | ORAL | 2 refills | Status: AC
  Filled 2024-01-31: qty 30, 30d supply, fill #0

## 2024-01-31 MED ORDER — SPIRONOLACTONE 50 MG PO TABS: 50.0000 mg | ORAL_TABLET | Freq: Every day | ORAL | 2 refills | Status: DC

## 2024-01-31 MED ORDER — ADULT MULTIVITAMIN W/MINERALS CH: 1.0000 | ORAL_TABLET | Freq: Every day | ORAL | 2 refills | Status: AC

## 2024-01-31 MED ORDER — FUROSEMIDE 20 MG PO TABS
40.0000 mg | ORAL_TABLET | Freq: Every day | ORAL | 1 refills | Status: DC
  Filled 2024-01-31: qty 60, 30d supply, fill #0

## 2024-01-31 MED ORDER — SPIRONOLACTONE 50 MG PO TABS
50.0000 mg | ORAL_TABLET | Freq: Every day | ORAL | 2 refills | Status: DC
  Filled 2024-01-31: qty 30, 30d supply, fill #0

## 2024-01-31 MED ORDER — FUROSEMIDE 20 MG PO TABS: 40.0000 mg | ORAL_TABLET | Freq: Every day | ORAL | 2 refills | Status: DC

## 2024-01-31 MED ORDER — PANTOPRAZOLE SODIUM 40 MG PO TBEC
40.0000 mg | DELAYED_RELEASE_TABLET | Freq: Every day | ORAL | 1 refills | Status: AC
  Filled 2024-01-31: qty 30, 30d supply, fill #0

## 2024-01-31 MED ORDER — VITAMIN B-1 100 MG PO TABS: 100.0000 mg | ORAL_TABLET | Freq: Every day | ORAL | 2 refills | Status: DC

## 2024-01-31 NOTE — Discharge Instructions (Signed)
 In a time of Crisis: [facility]  Mobile Crisis Management provides immediate crisis response, 24/7.  Call [phone]  [facility] for MH/DD/SA [facility] is available 24 hours a day, 7 days a week. Customer Service Specialists will assist you to find a crisis provider that is well-matched with your needs. Your local number is: [phone]  [facility] ([facility]) IOP, individual counseling, medication management [address] [place], [place] [address] [phone] Call for intake hours; Medicaid and Uninsured    Substance Use Outpatient Providers  [facility] ([facility] Group and individual counseling. [address]  [place], [place] [address] [phone] [place]: [phone]  [place]: [phone] Medicaid and uninsured.   [facility] Offers IOP groups multiple times per week. [address] Sherian Maroon [place], [place] [address] [phone] Takes Medicaid and other insurances.   [facility]  Chemical Dependency Intensive Outpatient Program (IOP) [address] [place], [place] [address] [phone] Takes Nurse, learning disability and [place].   [facility]  IOP and Partial Hospitalization Program  [address]  [place], [place] [address] [phone] Private Insurance, [place] only for partial hospitalization  [facility] [address], [address], [place], [place] [address] [phone] Monday-Friday. Short and Long term options.  [facility] ([facility]) IOP, individual counseling, medication management [address] [place], [place] [address] [phone] Medicaid and [facility]  [facility] [address]  [place], [place] [address] [phone] Private Insurance and Self Pay   [facility] Outpatient [address]  [place], [place] [address] [phone] Private Insurance, [place], and Self Pay   [facility]  [address] [place], [place] [address] [facility]  [address]  [place], [place] [address] [phone]  [facility]  [address] [place], [place] [address] [phone]      Residential Treatment Programs  [facility] [address] [place], [place] [address] [phone] or [phone] Detox and Residential Rehab 21 days (Medicaid, private insurance, and self pay. If Medicare, will look into funding). No methadone. Call for pre-screen.   [facility]) [address]  [place], [place] [address] [phone] Detox 3-7 days (self Pay and Medicaid Limited availability). Transitional Program for females needs 60 days clean first.  Rehab Only for Males (Medicare, Medicaid, and Self Pay)-No methadone.  [facility] [address] [place], [place] [address] [phone] or [phone] Private Insurance only  [facility] PHONE: [phone] FAX: [phone] Residential program for women 21 and over for up to a year through a Christian 12-step recovery model. Self-pay.    [facility] [address] [place], [place] [address] Phone:  [phone] Must be detoxed 72 hours prior to admission; 28 day program.  Self-pay.  [facility] [address]  [place], [place] [phone] [employer], Medicare, [place] (not straight [place]). They offer assistance with transportation.   [facility] [address] [place],  [place], [place] [address] [phone] Christian Based Program. Men only. No insurance  [employer] is a substance use disorder treatment program for women, including those who are pregnant, parenting, and/or whose lives have been touched by abuse and violence. (800)  [phone]  [facility]  [address]  [place], [place] [address] Women's: [phone] Men's: [phone] No Medicaid.   [facility] [place] Locations across the [place] (mainly [place]) willing to help with transportation.  [phone] [employer] [facility]  [address] [place]  [place], [place] [address] [phone] Treatment Only, must make assessment appointment, and must be sober for assessment appointment. Self pay, [facility], must be [facility] resident. No methadone.   [facility]  [address] [place], [place] [address] [phone] No pending legal charges, Long-term work program. No methadone. Call for assessment.  [facility]  [address], [place], [place] [address] [phone] or [phone] Commercial Insurance Only  [facility] - [phone] Private Insurance (no [place]). Males/Females, call to make referrals, multiple facilities.   [facility]: [facility] [address] [place], [place] [address] [phone]  [facility]-no methadone: [facility] [address] [place], [place] [address] [phone] Lacy Duverney [facility] [phone] [place], [place] For women, houses 8 residents for sober living. No Medicaid.         AA Meetings Website to locate meetings (virtually or in person): [email or web address].[email or web address].biz/ Phone: [phone]  Syringe Services Program: Due to COVID-19, syringe services programs are likely operating under different hours with limited or no fixed site hours. Some programs may not be operating at all. Please contact the program directly using the phone numbers provided below to see if they are still operating under COVID-19. [facility] Solution to the Opioid Problem (GCSTOP) Fixed;  mobile; peer-based;Bobbye Riggs) [phone] [email or web address] Fixed site exchange at [facility], [address] [place], [place] [address] on Wednesdays (2:00 - 5:00 pm) and Thursdays (4:00 - 8:00 pm). Pop-up mobile exchange locations: [employer], [address] [place], [place] [address] on Tuesdays (11:00 am - 1:00 pm) and Fridays (11:00 am - 1:00 pm) -[facility] - [address], [place], [place] [address] on Tuesdays (2:00 - 4:00 pm) and Fridays (2:00 - 4:00 pm) -[employer] copy - also serves Radio broadcast assistant and [employer] [place] [employer];Fixed; mobile; peer-based; Lendon Ka [phone] [email or web address] [address] [place], [place] [address] Delivery and outreach available in [place] and [place], please call for more information. Monday, Tuesday: 1:00 -7:00 pm, Thursday: 4:00 pm - 8:00 pm, Friday: 1:00 pm - 8:00 pm)  Medication-Assisted Treatment (MAT):  -[facility]- services [address] and surrounding areas including [place], [place], [place], [place], [address], [place], [place], [place], [place], and [place], [place]. Options include Methadone, buprenorphine or Suboxone. [address], [place], [place] [address] Phone: [phone] Mon - Fri: 5:30am - 2:00pm Sat: 5:30am -7:30am Sun: Closed Holidays: 6:00am - 8:00am  -[facility]- We use FDA-approved medications, like methadone/suboxone/sublocade, and vivitrol. These medications are then combined with customized care plans that include individual or group counseling, toxicology, and medical care directed by on-site physicians. Accepts most insurance plans, Medicaid, and private pay.  [address] [place], [place] [address] Phone: [phone] Monday-Friday 5:00 AM - 10:00 AM Saturday 6:00 AM - 8:30 AM Sunday 6:00 AM - 7:00 AM  -[facility]- [facility] is a treatment & recovery focused  program. In addition to receiving methadone medication, our clients participate in individual and group counseling as well as random drug testing. If accepted into the [facility] Opioid Program, you will be provided several intake appointments and a physical exam  [address], [place] [address] Office: [phone]  Fax: [phone]  -[facility]- We put our community members at the center of everything we do, for remote treatment services as well as in-person, from alcohol withdrawal to opioid use and more.  [address], [place], [place] [address] [phone] Monday-Wednesday: 9:00am - 5:00pm Thursday: 9:00am - 6:00pm Friday: 9:00am - 5:00pm Saturday: 9:00am - 1:00pm Sunday: Closed  -[facility] [place]) [address] [place], [place] [address] [phone]  [place] [phone] [address] [place], [place] [address]  M-W    5:00am-12:00pm Thu     5:00am-10:00am Fri       5:00am-12:00pm Sat      5:00am-8:00am Sun     Closed  $12/daily for Methadone Treatment.

## 2024-01-31 NOTE — Discharge Summary (Signed)
 Physician Discharge Summary   Patient: Erika Morris MRN: [id number] DOB: [birth year]  Admit date:     01/28/2024  Discharge date:   Discharge Physician: Concepcion Riser   PCP: Patient, No Pcp Per   Recommendations at discharge:  {Tip this will not be part of the note when signed- Example include specific recommendations for outpatient follow-up, pending tests to follow-up on. (Optional):26781}  PCP follow up in 1 week. GI follow up as scheduled.  Discharge Diagnoses: Principal Problem:   Acute GI bleeding Active Problems:   Macrocytic anemia   Alcoholic cirrhosis of liver with ascites (HCC)  Resolved Problems:   * No resolved hospital problems. *  Hospital Course: Erika Morris is a 38 y.o. female with chronic alcoholism, no known liver issues, chronic smoking, alcohol abuse presented to ED with multiple complaints including progressively worsening abdominal distention, jaundice, shortness of breath, easy bruising.    In the ED, afebrile, tachycardic to 110s, blood pressure in low normal range, breathing room air. Labs with WBC count 11.7, hemoglobin 7.9, platelets 244, sodium 129, potassium 3.1, BUN/creatinine < 5/0.6, albumin 1.8, AST 154, alk phos 182, total bili elevated 11.5. Blood alcohol level elevated at 94 Urinalysis showed hazy amber color urine with positive nitrite, rare bacteria Abdomen ultrasound showed Acute cholecystitis, Hepatic steatosis, Questionable portal vein thrombosis CT abdomen and pelvis - Hepatomegaly, early liver cirrhosis - Small volume paraesophageal and perigastric varices with moderate volume ascites.  Findings consistent with portal hypertension. - No visualized portal vein thrombosis. - Gallbladder wall edema, single gallstone -suspected acute cystitis - mild wall thickening of the right colon, likely due to underdistention and portal colopathy.    Patient was given IV Protonix, IV Rocephin. EDP spoke with [facility], general surgery,  admitted to [facility] service for further management evaluation   10/22, EGD showed small hiatal hernia, portal hypertensive gastropathy, congested duodenal mucosa, no varices seen.    Assessment/Plan: Acute GI bleeding Acute blood loss anemia Chronic macrocytic anemia Folate deficiency Presented with intermittent black stool, low hemoglobin in the setting of chronic alcohol use. Hemoglobin lowest at 6.3 on 10/21.  2 units PRBC transfused. GI performed EGD 01/30/24 showed small hiatal hernia, portal hypertensive gastropathy, congested duodenal mucosa, no varices seen. GI recommended low-sodium diet, Aldactone 50 mg daily and Lasix 40 mg once daily.  No need for beta-blocker in the absence of varices Continue PPI daily. Folic acid level low also started on folic acid supplementation.  Vitamin B12 levels adequate at 720. Advised outpatient PCP follow-up and GI follow-up.  Patient understands and agrees with the discharge plan.   Alcoholic hepatitis  Alcoholic liver cirrhosis Portal hypertension Ascites, varices Imagings reviewed.  Has hepatic steatosis as well as liver cirrhosis findings, portal hypertension changes with moderate ascites, esophageal varices.  Has grossly enlarged liver probably because of alcohol hepatitis. Elevated liver enzymes.  Significantly elevated bilirubin to >11.   Ammonia level was elevated mildly. Mental status intact GI advised to start prednisone 40 mg daily. Unable to perform paracentesis as she has minimal peritoneal fluid as per IR. LFTs improving. GI advised to taper prednisone-40 mg daily for 14 days, 30 mg for 5 days, 20 mg 5 days, 10 mg for 5 days then stop. Patient will follow GI as scheduled.   Chronic alcohol use  High risk of alcohol withdrawal Was drinking 15 beers daily, recently cut back to 2-3 8% 12 ounce beers a night. Blood alcohol level at presentation elevated to 94. He was placed on CIWA protocol.  I discussed with her to quit alcohol use.   She is motivated to stop drinking due to her liver condition. Continue thiamine, folate, multivitamin.   Cholelithiasis Ultrasound abdomen and CT abdomen reviewed.  Also raised suspicion of acute cholecystitis but per GI and general surgery, gallbladder wall edema is likely due to portal hypertension.   Possible right hepatic lobe mass CT abdomen also suggested a masslike enhancement in the right hepatic lobe measuring 8.2 cm.  AFP level sent, not elevated.  Patient will follow-up with GI as outpatient.   Abnormal urinalysis No urine cultures obtained.  She is asymptomatic.  She did receive 3 days of Rocephin.  No antibiotics needed at this time.  Hypoglycemia Blood sugar level low at 63 on 10/20 No prior history of diabetes A1c 4.9.   Hypokalemia Potassium level improved with replacement.   Hyponatremia Likely hypervolemic hyponatremia in the setting of liver cirrhosis and ascites Stable.  Chronic smoker Smokes 1 pack/day.  Counseled to quit.  Nicotine patch offered   Constipation Reported minimal BM in last 2 weeks. CT imaging did not show significant stool burden. Advised stool softeners, laxatives.    {Tip this will not be part of the note when signed Body mass index is 22.2 kg/m. , ,  (Optional):26781}  {(NOTE) Pain control PDMP Statment (Optional):26782} Consultants: GI, Surgery Procedures performed: Upper GI endoscopy Disposition: Home Diet recommendation:  Discharge Diet Orders (From admission, onward)     Start     Ordered   01/31/24 0000  Diet - low sodium heart healthy        01/31/24 1241           Cardiac diet DISCHARGE MEDICATION: Allergies as of 01/31/2024       Reactions   Coconut (cocos Nucifera) Other (See Comments)   Reaction??   Dog Epithelium Other (See Comments)   Sneezing   Grass Pollen([id number]-o-[id number]-t-swt Vern) Other (See Comments)   Sneezing   Pollen Extract Other (See Comments)   Sneezing        Medication List     STOP  taking these medications    clindamycin  150 MG capsule Commonly known as: Cleocin    Flintstones Gummies Complete Chew   gabapentin  300 MG capsule Commonly known as: Neurontin        TAKE these medications    EPINEPHrine  0.3 mg/0.3 mL Soaj injection Commonly known as: EPI-PEN Inject 0.3 mg into the muscle as needed for anaphylaxis.   folic acid 1 MG tablet Commonly known as: FOLVITE Take 1 tablet (1 mg total) by mouth daily. Start taking on: February 01, 2024   furosemide 20 MG tablet Commonly known as: LASIX Take 2 tablets (40 mg total) by mouth daily. Start taking on: February 01, 2024   multivitamin with minerals Tabs tablet Take 1 tablet by mouth daily. Start taking on: February 01, 2024   pantoprazole 40 MG tablet Commonly known as: Protonix Take 1 tablet (40 mg total) by mouth daily.   predniSONE 20 MG tablet Commonly known as: DELTASONE Take 2 tablets (40 mg total) by mouth daily with breakfast for 14 days, THEN 1.5 tablets (30 mg total) daily with breakfast for 5 days, THEN 1 tablet (20 mg total) daily with breakfast for 5 days, THEN 0.5 tablets (10 mg total) daily with breakfast for 5 days. Start taking on: January 31, 2024   spironolactone 50 MG tablet Commonly known as: ALDACTONE Take 1 tablet (50 mg total) by mouth daily. Start taking on: February 01, 2024  thiamine 100 MG tablet Commonly known as: Vitamin B-1 Take 1 tablet (100 mg total) by mouth daily. Start taking on: February 01, 2024        Follow-up Information     Danis, Victory CROME III, MD Follow up in 3 week(s).   Specialty: Gastroenterology Contact information: [address] [place] [address] [place] [address] [phone]                Discharge Exam: Fredricka Weights   01/28/24 1137 01/30/24 1243  Weight: 66.2 kg 66.2 kg   General - Young  Caucasian female, no apparent distress HEENT - PERRLA, EOMI, atraumatic head, non tender sinuses. Lung - Clear, no rales, rhonchi,  wheezes. Heart - S1, S2 heard, no murmurs, rubs, trace pedal edema. Abdomen - Soft, non tender, distended, bowel sounds good Neuro - Alert, awake and oriented x 3, non focal exam. Skin - Warm and dry.  Condition at discharge: stable  The results of significant diagnostics from this hospitalization (including imaging, microbiology, ancillary and laboratory) are listed below for reference.   Imaging Studies: IR ABDOMEN US  LIMITED Result Date: 01/30/2024 CLINICAL DATA:  38 year old female with history of chronic alcoholism and no known liver disease. Currently admitted for progressively worsening abdominal distension and jaundice. EXAM: ULTRASOUND ABDOMEN LIMITED COMPARISON:  CT ABDOMEN PELVIS W CONTRAST-01/28/2024 FINDINGS: Limited ultrasound done of all 4 quadrants. There is only small volume of ascites present. There is no pocket of fluid large enough to allow for safe approach for paracentesis. IMPRESSION: Small volume ascites without pocket of fluid large enough to allow for safe approach for paracentesis. Ultrasound performed by: Sherrilee Bal, PA-C Electronically Signed   By: CHRISTELLA.  Shick M.D.   On: 01/30/2024 14:47   CT ABDOMEN PELVIS W CONTRAST Result Date: 01/28/2024 CLINICAL DATA:  Abdominal pain, nausea vomiting, constipation, jaundice EXAM: CT ABDOMEN AND PELVIS WITH CONTRAST TECHNIQUE: Multidetector CT imaging of the abdomen and pelvis was performed using the standard protocol following bolus administration of intravenous contrast. RADIATION DOSE REDUCTION: This exam was performed according to the departmental dose-optimization program which includes automated exposure control, adjustment of the mA and/or kV according to patient size and/or use of iterative reconstruction technique. CONTRAST:  75mL OMNIPAQUE  IOHEXOL  350 MG/ML SOLN COMPARISON:  01/28/2024 right upper quadrant ultrasound FINDINGS: Lower chest: No focal airspace consolidation or pleural effusion. Hepatobiliary: Hepatomegaly.  Subtle nodular contour of the liver with irregular hypodensity throughout the liver parenchyma. There is masslike enhancement in the right hepatic lobe (axial 17), measuring 8.2 cm.Punctate radiopaque gallstone. Circumferential wall edema surrounding the decompressed gallbladder. No intrahepatic or extrahepatic biliary ductal dilation. The portal veins are patent. Small paraesophageal and perigastric varices. Pancreas: No mass or main ductal dilation. No peripancreatic inflammation or fluid collection. Spleen: Normal size. No mass. Adrenals/Urinary Tract: No adrenal masses. No renal mass. No nephrolithiasis or hydronephrosis. The urinary bladder is distended without focal abnormality. Stomach/Bowel: The stomach is decompressed without focal abnormality. No small bowel wall thickening or inflammation. No small bowel obstruction.Decompressed appendiceal stump. Mild wall thickening of the right colon. Vascular/Lymphatic: No aortic aneurysm. Scattered aortoiliac atherosclerosis. No intraabdominal or pelvic lymphadenopathy. Reproductive: The uterus and ovaries are within normal limits for patient's age.No free pelvic fluid. Left adnexal tubal ligation clip. The right clip is displaced layering in the rectouterine pouch. Other: No pneumoperitoneum. Moderate volume ascites predominantly in the lower abdomen and pelvis. Hazy mesenteric edema. Musculoskeletal: No acute fracture or destructive lesion. IMPRESSION: 1. Hepatomegaly. Subtle nodular contour of the liver, worrisome  for cirrhosis. Irregular hypodensity throughout the liver parenchyma, most likely reflecting geographic hepatic steatosis. In the right hepatic lobe, there is masslike enhancement measuring 8.2 cm (axial 17), which most likely represents focal fatty sparing. Nonemergent multiphase abdominal MRI with IV contrast recommended to exclude underlying hepatic mass (such as HCC). 2. Small volume paraesophageal and perigastric varices with moderate volume  ascites. Given the abnormal flow in the portal vein on the recent ultrasound, this constellation of findings is consistent with portal hypertension. No visualized portal vein thrombosis. 3. Circumferential wall edema involving the decompressed gallbladder. There is a single punctate radiopaque gallstone present. While this could represent changes of acute cholecystitis, gallbladder wall edema may be related to underlying chronic liver disease hypoproteinemia. If concern persists for acute cholecystitis, a nuclear medicine hepatobiliary scan would be recommended. 4. Mild wall thickening of the right colon, likely due to underdistention and portal colopathy. Aortic Atherosclerosis (ICD10-I70.0). Electronically Signed   By: Rogelia Myers M.D.   On: 01/28/2024 16:23   US  Abdomen Limited RUQ (LIVER/GB) Result Date: 01/28/2024 CLINICAL DATA:  Abdominal pain. EXAM: ULTRASOUND ABDOMEN LIMITED RIGHT UPPER QUADRANT COMPARISON:  None Available. FINDINGS: Gallbladder: Shadowing echogenic stone measures 8 mm. Slight wall thickening, 4 mm. Positive sonographic Murphy sign. Pericholecystic fluid. Common bile duct: Diameter: 3 mm, within normal limits. No intrahepatic biliary ductal dilatation. Liver: Diffusely increased in echogenicity. Poor signal in the portal vein on Doppler imaging with limited assessment of flow direction, which may be reversed. Other: None. IMPRESSION: 1. Acute cholecystitis. 2. Poor signal in the portal vein with questionable reversal of flow, worrisome for portal vein thrombosis. 3. Hepatic steatosis. Electronically Signed   By: Newell Eke M.D.   On: 01/28/2024 14:20    Microbiology: Results for orders placed or performed during the hospital encounter of 08/28/20  Resp Panel by RT-PCR (Flu A&B, Covid) Nasopharyngeal Swab     Status: None   Collection Time: 08/28/20  1:26 PM   Specimen: Nasopharyngeal Swab; Nasopharyngeal(NP) swabs in vial transport medium  Result Value Ref Range Status    SARS Coronavirus 2 by RT PCR NEGATIVE NEGATIVE Final   Influenza A by PCR NEGATIVE NEGATIVE Final   Influenza B by PCR NEGATIVE NEGATIVE Final    Comment: Performed at [facility], [address], [place], [place] [address]    Labs: CBC: Recent Labs  Lab 01/28/24 1140 01/29/24 0421 01/30/24 0529 01/31/24 0300  WBC 11.7* 9.6 7.9 9.7  NEUTROABS 8.4*  --  6.6 7.7  HGB 7.9* 6.3* 9.2* 8.5*  HCT 23.3* 18.9* 26.9* 25.1*  MCV 107.9* 113.2* 103.5* 105.0*  PLT 244 194 180 196   Basic Metabolic Panel: Recent Labs  Lab 01/28/24 1140 01/29/24 0421 01/30/24 0529 01/31/24 0300  NA 129* 129* 129* 130*  K 3.1* 3.4* 4.3 4.9  CL 93* 97* 100 100  CO2 21* 21* 21* 22  GLUCOSE 85 63* 122* 133*  BUN <5* <5* <5* 7  CREATININE 0.69 0.70 0.57 0.65  CALCIUM 7.9* 7.5* 7.9* 7.9*  MG  --  1.8  --   --   PHOS  --  4.1  --   --    Liver Function Tests: Recent Labs  Lab 01/28/24 1140 01/29/24 0421 01/30/24 0529 01/31/24 0300  AST 154* 125* 114* 113*  ALT 27 25 25 26   ALKPHOS 182* 153* 154* 151*  BILITOT 11.5* 12.5* 11.9* 9.3*  PROT 7.1 6.6 6.7 6.5  ALBUMIN 1.8* 1.6* 1.6* 1.6*   CBG: Recent Labs  Lab 01/30/24 1112 01/30/24 1401 01/30/24 1626 01/30/24 2023 01/31/24 0932  GLUCAP 98 84 123* 152* 120*    Discharge time spent: 36 minutes.  Signed: Concepcion Riser, MD [facility] 01/31/2024

## 2024-01-31 NOTE — Progress Notes (Signed)
 Nurse to Nurse Report: From Home with Husband, EGD Yesterday, paracentesis attempt this admission, telemetry box 22: ST, LBM: 10/06 (providers aware), PIVRAC: 75mL NS, possible d/c today   Received From [facility], RN Neuro- a/o x 4 HEENT- Juandice in sclera  CV- telemetry box 22: ST Respiratory- No concerns GI- LBM 10/6 (providers aware), mild ascites  GU- No concerns  Reproduction- No concerns  MSK- up independently  Skin- Jaundice  Behavior- pleasant  Plan- possible d/c today  Shift Phone: [phone] Nurse Tech for Patient: Q, NT   Morning Assessment:  Neuro- HEENT- CV- Respiratory-  GI- GU- Reproduction- MSK- Skin- Behavior-  Patient Endorses:   Patient Denies:   0800- 1000- 1200- 1400- 1600- 1800-  Evening Assessment:   This RN recommends:   Nurse to Nurse Report:   Given to   In the event that Epic Flowsheet is incomplete, please utilize this progress note for this RN shift assessment of patient. [Documented by exception]   Gwyneth Razor, RN

## 2024-02-01 ENCOUNTER — Other Ambulatory Visit (HOSPITAL_COMMUNITY): Payer: Self-pay

## 2024-02-03 ENCOUNTER — Other Ambulatory Visit (HOSPITAL_COMMUNITY): Payer: Self-pay

## 2024-02-04 ENCOUNTER — Other Ambulatory Visit (HOSPITAL_COMMUNITY): Payer: Self-pay

## 2024-02-13 NOTE — Progress Notes (Unsigned)
 Chief Complaint: Cirrhosis  HPI:    Erika Morris is a 38 year old female, known to Dr. Legrand, with a past medical history as listed below including cirrhosis, who presents to clinic today for cirrhosis.    01/28/2024 patient consulted by our service for generalized abdominal pain and jaundice.  At that time discussed 2 weeks of constipation, alcohol use disorder causing elevated LFTs in a mixed pattern.  Clinical picture suggested cirrhosis with portal hypertension and ongoing alcohol use with marked hepatomegaly.  Also gallbladder stone.  Discussed abnormal imaging of the GI tract with a masslike abnormality the radiologist favored to be focal fatty sparing but recommended MRI.  Macrocytic anemia likely related to marrow suppression from alcohol use disorder.  Further workup was conducted including hep A, B and C serologies, B12, folic acid, iron studies, AFP and CMP.    01/28/2024 right upper quadrant ultrasound with acute cholecystitis, poor signal in the portal vein with questionable reversal of flow, worrisome for portal vein thrombosis, hepatic steatosis.    01/28/2024 CTAP with contrast for abdominal pain, nausea, vomiting and constipation with hepatomegaly and subtle nodular contour of the liver with irregular hypodensity throughout the liver parenchyma.  Masslike enhancement in the right hepatic lobe.  MRI recommended.  Small volume paraesophageal and perigastric varices with moderate volume ascites.  Consistent with portal hypertension.  Circumferential wall edema involving the decompressed gallbladder.  Mild wall thickening of the right colon likely due to underdistention and portal colopathy.    01/29/2024 hep A antibody reactive, hep C nonreactive, hep B surface antibody reactive, ammonia 50, CMP with a potassium of 3.4, sodium 129, AST 125, ALT 25, alk phos 153, total bili 12.5.    01/30/2024 EGD with small hiatal hernia, portal hypertensive gastropathy and congested duodenal mucosa.  Patient  started on Spironolactone 50 mg once a day and Furosemide 40 mg once daily.  There were no varices.    01/30/2024 IR-guided abdominal ultrasound with small volume ascites without pocket of fluid large enough to allow for safe approach for paracentesis.    01/31/2024 patient started on a prednisone taper for alcoholic hepatitis, advised 40 mg daily for 14 days, then 30 mg for 5 days, then 20 mg for 5 days, then 10 mg for 5 days and stop.  aFP returned normal.  MRI was not pursued.    Today, patient presents to clinic today for follow up after recent hospitalization for abdominal pain with nausea and vomiting and a finding of cirrhosis and alcoholic hepatitis.  Started on Prednisone by our service with EGD showing portal hypertensive gastropathy.  She explains that she has had massive swelling since leaving the hospital.  Noticing abdominal swelling as well as peripheral edema up to the level of her knee, she cannot fit in most of her clothes.  She continues on all of the medicines given her at the hospital including Aldactone 50 mg daily and Lasix 40 mg daily as well as Prednisone currently on 40 mg daily with a taper in place.  She has not been drinking any further alcohol since discharge.  No nausea or vomiting.  Associated symptoms include generalized abdominal pain and discomfort.  She also has been having trouble sleeping.    Denies fever, chills or weight loss.    Past Medical History:  Diagnosis Date   Heart murmur     Past Surgical History:  Procedure Laterality Date   APPENDECTOMY     ESOPHAGOGASTRODUODENOSCOPY N/A 01/30/2024   Procedure: EGD (ESOPHAGOGASTRODUODENOSCOPY);  Surgeon:  Erika Victory LITTIE DOUGLAS, MD;  Location: [facility] ENDOSCOPY;  Service: Gastroenterology;  Laterality: N/A;   TUBAL LIGATION      Current Outpatient Medications  Medication Sig Dispense Refill   EPINEPHrine  0.3 mg/0.3 mL IJ SOAJ injection Inject 0.3 mg into the muscle as needed for anaphylaxis. 2 each 1   folic acid  (FOLVITE) 1 MG tablet Take 1 tablet (1 mg total) by mouth daily. 30 tablet 2   furosemide (LASIX) 20 MG tablet Take 2 tablets (40 mg total) by mouth daily. 60 tablet 1   Multiple Vitamin (MULTIVITAMIN WITH MINERALS) TABS tablet Take 1 tablet by mouth daily. 90 tablet 2   pantoprazole (PROTONIX) 40 MG tablet Take 1 tablet (40 mg total) by mouth daily. 30 tablet 1   predniSONE (DELTASONE) 20 MG tablet Take 2 tablets (40 mg total) by mouth daily with breakfast for 14 days, THEN 1.5 tablets (30 mg total) daily with breakfast for 5 days, THEN 1 tablet (20 mg total) daily with breakfast for 5 days, THEN 0.5 tablets (10 mg total) daily with breakfast for 5 days. 43 tablet 0   spironolactone (ALDACTONE) 50 MG tablet Take 1 tablet (50 mg total) by mouth daily. 30 tablet 2   thiamine (VITAMIN B1) 100 MG tablet Take 1 tablet (100 mg total) by mouth daily. 30 tablet 2   No current facility-administered medications for this visit.    Allergies as of 02/14/2024 - Review Complete 01/30/2024  Allergen Reaction Noted   Coconut (cocos nucifera) Other (See Comments) 08/26/2022   Dog epithelium Other (See Comments) 08/26/2022   Grass pollen(k-o-r-t-swt vern) Other (See Comments) 08/26/2022   Pollen extract Other (See Comments) 08/26/2022    No family history on file.  Social History   Socioeconomic History   Marital status: Single    Spouse name: Not on file   Number of children: Not on file   Years of education: Not on file   Highest education level: Not on file  Occupational History   Not on file  Tobacco Use   Smoking status: Every Day    Current packs/day: 1.50    Types: Cigarettes   Smokeless tobacco: Never  Vaping Use   Vaping status: Never Used  Substance and Sexual Activity   Alcohol use: Yes    Comment: Socail drink   Drug use: Yes    Types: Marijuana   Sexual activity: Not on file  Other Topics Concern   Not on file  Social History Narrative   ** Merged History Encounter **        Social Drivers of Health   Financial Resource Strain: Not on file  Food Insecurity: No Food Insecurity (01/29/2024)   Hunger Vital Sign    Worried About Running Out of Food in the Last Year: Never true    Ran Out of Food in the Last Year: Never true  Transportation Needs: No Transportation Needs (01/29/2024)   PRAPARE - Administrator, Civil Service (Medical): No    Lack of Transportation (Non-Medical): No  Physical Activity: Not on file  Stress: Not on file  Social Connections: Unknown (06/26/2022)   Received from [facility]   Social Network    Social Network: Not on file  Intimate Partner Violence: Not At Risk (01/29/2024)   Humiliation, Afraid, Rape, and Kick questionnaire    Fear of Current or Ex-Partner: No    Emotionally Abused: No    Physically Abused: No    Sexually Abused: No  Review of Systems:    Constitutional: No weight loss, fever or chills Cardiovascular: No chest pain Respiratory: No SOB or cough Gastrointestinal: See HPI and otherwise negative   Physical Exam:  Vital signs: BP 110/66   Pulse (!) 117   Ht 5' 8 (1.727 m)   Wt 167 lb (75.8 kg)   BMI 25.39 kg/m    Constitutional:   Pleasant jaundiced ill-appearing Caucasian female appears to be in NAD, Well developed, Well nourished, alert and cooperative Head:  Normocephalic and atraumatic. Eyes:   PEERL, EOMI. + icterus. Conjunctiva pink. Ears:  Normal auditory acuity. Neck:  Supple Throat: Poor dentition, missing all of her upper incisors Respiratory: Respirations even and unlabored. Lungs clear to auscultation bilaterally.   No wheezes, crackles, or rhonchi.  Cardiovascular: Normal S1, S2. No MRG. Regular rate and rhythm. + Pitting edema to the level of the knee bilaterally + JVD Gastrointestinal: Tense, moderate to marked distention, mild generalized TTP. No rebound or guarding. Normal bowel sounds. No appreciable masses or hepatomegaly.+ Fluid wave Rectal:  Not performed.  Msk:   Symmetrical without gross deformities. Without edema, no deformity or joint abnormality.  Neurologic:  Alert and  oriented x4;  grossly normal neurologically.  Skin:   Dry and intact without significant lesions or rashes.  No asterixis Psychiatric: Demonstrates good judgement and reason without abnormal affect or behaviors.  RELEVANT LABS AND IMAGING: CBC    Component Value Date/Time   WBC 9.7 01/31/2024 0300   RBC 2.39 (L) 01/31/2024 0300   HGB 8.5 (L) 01/31/2024 0300   HCT 25.1 (L) 01/31/2024 0300   PLT 196 01/31/2024 0300   MCV 105.0 (H) 01/31/2024 0300   MCH 35.6 (H) 01/31/2024 0300   MCHC 33.9 01/31/2024 0300   RDW 19.5 (H) 01/31/2024 0300   LYMPHSABS 1.4 01/31/2024 0300   MONOABS 0.5 01/31/2024 0300   EOSABS 0.0 01/31/2024 0300   BASOSABS 0.0 01/31/2024 0300    CMP     Component Value Date/Time   NA 130 (L) 01/31/2024 0300   K 4.9 01/31/2024 0300   CL 100 01/31/2024 0300   CO2 22 01/31/2024 0300   GLUCOSE 133 (H) 01/31/2024 0300   BUN 7 01/31/2024 0300   CREATININE 0.65 01/31/2024 0300   CALCIUM 7.9 (L) 01/31/2024 0300   PROT 6.5 01/31/2024 0300   ALBUMIN 1.6 (L) 01/31/2024 0300   AST 113 (H) 01/31/2024 0300   ALT 26 01/31/2024 0300   ALKPHOS 151 (H) 01/31/2024 0300   BILITOT 9.3 (H) 01/31/2024 0300   GFRNONAA >60 01/31/2024 0300    Assessment: 1.  Cirrhosis with portal hypertension: No varices seen at time of recent EGD during hospitalization on 01/29/2024, but portal hypertensive gastropathy, imaging suggestive of cirrhosis, hep C antibody negative, ammonia 50, sodium 129, potassium 3.4, CO2 21/BUN less than 5, creatinine 0.7, albumin 1.6, T. bili 12.5/alk phos 153/AST 125, hemoglobin 6.3, discriminant function score 34, started on steroids for alcoholic hepatitis, ultrasound-guided paracentesis attempted but fluid pocket not great enough, now with increasing abdominal distention and peripheral edema and continued jaundice 2.  Macrocytic anemia: thought  multifactorial in the setting bone marrow suppression possible B12 deficiency, received 1 unit of PRBCs in the hospital, last hemoglobin 8.5 on 10/23 3.  Alcoholism: Patient has not had any alcohol since discharge on 10/23, LFTs found to be in a mixed pattern on admission, clinical picture strongly suggested cirrhosis with portal hypertension  Plan: 1.  At time of recent admission abnormal imaging of the liver,  but AFP was normal so MRI not pursued.  Will allow Dr. Legrand to review notes from today to make sure that he does not feel like this would be beneficial. 2.  With increased ascites and peripheral edema on exam today ordered an IR ultrasound with paracentesis.  Fluid to be sent for cytology, cell count and differential, Gram stain, culture and protein level.  Albumin replacement 8 g for every liter greater than 5 L removed. 3.  Patient currently on Lasix 40 mg daily and spironolactone 50 mg daily, this will likely need to be adjusted but will recheck labs today first. 4.  Ordered a CBC, CMP, PT/INR today. Also Ammonia. 5.  Continue Prednisone as indicated with taper.  Patient discharged on 40 mg twice a day for 14 days, then 30 mg daily with breakfast for 5 days, then 20 mg with breakfast for 5 days, then 10 mg with breakfast for 5 days. 6.  Continue alcohol abstinence. 7.  Discussed ER precautions with the patient.  If she has increased shortness of breath, palpitations, dizziness or increased edema would recommend she go to the ER. 8.  Patient to follow in clinic per recommendations after labs and procedure above.  Will need close follow-up.  Delon Failing, PA-C [facility] 02/13/2024, 4:49 PM  Cc: No ref. provider found

## 2024-02-14 ENCOUNTER — Other Ambulatory Visit: Payer: Self-pay

## 2024-02-14 ENCOUNTER — Encounter: Payer: Self-pay | Admitting: Physician Assistant

## 2024-02-14 ENCOUNTER — Ambulatory Visit: Payer: Self-pay | Admitting: Physician Assistant

## 2024-02-14 VITALS — BP 110/66 | HR 117 | Ht 68.0 in | Wt 167.0 lb

## 2024-02-14 DIAGNOSIS — K7031 Alcoholic cirrhosis of liver with ascites: Secondary | ICD-10-CM

## 2024-02-14 DIAGNOSIS — K7011 Alcoholic hepatitis with ascites: Secondary | ICD-10-CM

## 2024-02-14 DIAGNOSIS — R1084 Generalized abdominal pain: Secondary | ICD-10-CM

## 2024-02-14 DIAGNOSIS — K766 Portal hypertension: Secondary | ICD-10-CM

## 2024-02-14 LAB — CBC WITH DIFFERENTIAL/PLATELET
Basophils Absolute: 0.1 K/uL (ref 0.0–0.1)
Basophils Relative: 0.7 % (ref 0.0–3.0)
Eosinophils Absolute: 0 K/uL (ref 0.0–0.7)
Eosinophils Relative: 0.4 % (ref 0.0–5.0)
HCT: 26.6 % — ABNORMAL LOW (ref 36.0–46.0)
Hemoglobin: 9 g/dL — ABNORMAL LOW (ref 12.0–15.0)
Lymphocytes Relative: 10.1 % — ABNORMAL LOW (ref 12.0–46.0)
Lymphs Abs: 1 K/uL (ref 0.7–4.0)
MCHC: 33.9 g/dL (ref 30.0–36.0)
MCV: 105.4 fl — ABNORMAL HIGH (ref 78.0–100.0)
Monocytes Absolute: 0.7 K/uL (ref 0.1–1.0)
Monocytes Relative: 7.3 % (ref 3.0–12.0)
Neutro Abs: 7.9 K/uL — ABNORMAL HIGH (ref 1.4–7.7)
Neutrophils Relative %: 81.5 % — ABNORMAL HIGH (ref 43.0–77.0)
Platelets: 204 K/uL (ref 150.0–400.0)
RBC: 2.52 Mil/uL — ABNORMAL LOW (ref 3.87–5.11)
RDW: 15.7 % — ABNORMAL HIGH (ref 11.5–15.5)
WBC: 9.7 K/uL (ref 4.0–10.5)

## 2024-02-14 LAB — COMPREHENSIVE METABOLIC PANEL WITH GFR
ALT: 78 U/L — ABNORMAL HIGH (ref 0–35)
AST: 132 U/L — ABNORMAL HIGH (ref 0–37)
Albumin: 2.9 g/dL — ABNORMAL LOW (ref 3.5–5.2)
Alkaline Phosphatase: 138 U/L — ABNORMAL HIGH (ref 39–117)
BUN: 6 mg/dL (ref 6–23)
CO2: 25 meq/L (ref 19–32)
Calcium: 8.8 mg/dL (ref 8.4–10.5)
Chloride: 101 meq/L (ref 96–112)
Creatinine, Ser: 0.38 mg/dL — ABNORMAL LOW (ref 0.40–1.20)
GFR: 128.37 mL/min (ref >60.00)
Glucose, Bld: 120 mg/dL — ABNORMAL HIGH (ref 70–99)
Potassium: 3.8 meq/L (ref 3.5–5.1)
Sodium: 134 meq/L — ABNORMAL LOW (ref 135–145)
Total Bilirubin: 5.1 mg/dL — ABNORMAL HIGH (ref 0.2–1.2)
Total Protein: 6.9 g/dL (ref 6.0–8.3)

## 2024-02-14 LAB — PROTIME-INR
INR: 1.8 ratio — ABNORMAL HIGH (ref 0.8–1.0)
Prothrombin Time: 18.6 s — ABNORMAL HIGH (ref 9.6–13.1)

## 2024-02-14 LAB — AMMONIA: Ammonia: 71 umol/L — ABNORMAL HIGH (ref 11–35)

## 2024-02-14 NOTE — Patient Instructions (Addendum)
 Your provider has requested that you go to the basement level for lab work before leaving today. Press B on the elevator. The lab is located at the first door on the left as you exit the elevator.   You have been scheduled for an abdominal paracentesis at [facility], Entrance A (1st floor of hospital) on Monday 02/18/24 at 9 am. Please arrive at least 30 minutes prior to your appointment time for registration. Should you need to reschedule this appointment for any reason, please call our office at [phone].  Continue Prednisone taper.   Go to ER if worsening ascites, shortness of breath or abdominal pain.   _______________________________________________________  If your blood pressure at your visit was 140/90 or greater, please contact your primary care physician to follow up on this.  _______________________________________________________  If you are age 37 or older, your body mass index should be between 23-30. Your Body mass index is 25.39 kg/m. If this is out of the aforementioned range listed, please consider follow up with your Primary Care Provider.  If you are age 28 or younger, your body mass index should be between 19-25. Your Body mass index is 25.39 kg/m. If this is out of the aformentioned range listed, please consider follow up with your Primary Care Provider.   ________________________________________________________  The [facility] providers would like to encourage you to use MYCHART to communicate with providers for non-urgent requests or questions.  Due to long hold times on the telephone, sending your provider a message by [facility] may be a faster and more efficient way to get a response.  Please allow 48 business hours for a response.  Please remember that this is for non-urgent requests.  _______________________________________________________  [facility] is using a team-based approach to care.  Your team is made up of your doctor and two  to three APPS. Our APPS (Nurse Practitioners and Physician Assistants) work with your physician to ensure care continuity for you. They are fully qualified to address your health concerns and develop a treatment plan. They communicate directly with your gastroenterologist to care for you. Seeing the Advanced Practice Practitioners on your physician's team can help you by facilitating care more promptly, often allowing for earlier appointments, access to diagnostic testing, procedures, and other specialty referrals.

## 2024-02-15 ENCOUNTER — Ambulatory Visit: Payer: Self-pay | Admitting: Physician Assistant

## 2024-02-17 NOTE — Progress Notes (Signed)
 ____________________________________________________________  Attending physician addendum:  Thank you for sending this case to me. I have reviewed the entire note and agree with the general plan, though with some changes noted below:  1-yes, she should have the MRI of the liver with and without contrast.  Clinical staff can check into the feasibility for doing this because I believe she is currently without medical coverage  2- -she was supposed to be discharged on 28 days of prednisolone, but it appears to have been changed to prednisone (most likely for cost reasons).  Prednisone may be causing increased fluid retention, and it is nearly 3 weeks out from her initial day of hospitalization. With improvement in albumin and bilirubin (we are past the 7-day timeframe for a Lille score), we need to taper the prednisone more quickly. Please have clinical staff reach her by phone and have her drop the prednisone down to 20 mg once daily for 5 days, then 10 mg once daily for 5 days, then stop.  3 -agree with therapeutic paracentesis with albumin.   4 -if she is currently on furosemide 40 mg once daily and spironolactone 50 mg once daily as you recorded, then starting about 2 days after her paracentesis, increase the spironolactone to 100 mg daily.  We must proceed with caution on diuretic dose increases given her relatively low blood pressure.  5 -she needs a follow-up clinic visit in 4 to 6 weeks and a CMP in 2 weeks.  Victory Brand, MD  ____________________________________________________________

## 2024-02-18 ENCOUNTER — Ambulatory Visit (HOSPITAL_COMMUNITY)
Admission: RE | Admit: 2024-02-18 | Discharge: 2024-02-18 | Disposition: A | Discharge status: Home / Self Care | Payer: MEDICAID | Source: Ambulatory Visit | Attending: Physician Assistant | Admitting: Physician Assistant

## 2024-02-18 ENCOUNTER — Other Ambulatory Visit: Payer: Self-pay | Admitting: Physician Assistant

## 2024-02-18 ENCOUNTER — Telehealth: Payer: Self-pay

## 2024-02-18 DIAGNOSIS — K7031 Alcoholic cirrhosis of liver with ascites: Secondary | ICD-10-CM | POA: Insufficient documentation

## 2024-02-18 DIAGNOSIS — K766 Portal hypertension: Secondary | ICD-10-CM

## 2024-02-18 DIAGNOSIS — K7011 Alcoholic hepatitis with ascites: Secondary | ICD-10-CM

## 2024-02-18 DIAGNOSIS — R1084 Generalized abdominal pain: Secondary | ICD-10-CM

## 2024-02-18 MED ORDER — LIDOCAINE-EPINEPHRINE 1 %-1:100000 IJ SOLN
INTRAMUSCULAR | Status: AC
  Filled 2024-02-18: qty 1

## 2024-02-18 NOTE — Telephone Encounter (Signed)
 Called number listed for significant other. No answer. No voicemail.

## 2024-02-18 NOTE — Telephone Encounter (Signed)
 Patient had her paracentesis today at 9:00 am.  Tried to contact the patient. No answer. No voicemail.

## 2024-02-18 NOTE — Progress Notes (Signed)
Patient presents for  therapeutic and diagnostic paracentesis. US limited abdomen shows trace amount of peritoneal fluid noted  Insufficient to perform a safe paracentesis. Procedure not performed.  

## 2024-02-18 NOTE — Telephone Encounter (Signed)
 No answer, no voicemail on phones of significant other Ellender Sharper or grandfather Huma Imhoff.

## 2024-02-18 NOTE — Telephone Encounter (Signed)
 Erika Victory LITTIE DOUGLAS, MD at 02/14/2024  1:30 PM  Status: Signed  ____________________________________________________________   Attending physician addendum:   Thank you for sending this case to me. I have reviewed the entire note and agree with the general plan, though with some changes noted below:   1-yes, she should have the MRI of the liver with and without contrast.  Clinical staff can check into the feasibility for doing this because I believe she is currently without medical coverage   2- -she was supposed to be discharged on 28 days of prednisolone, but it appears to have been changed to prednisone (most likely for cost reasons).  Prednisone may be causing increased fluid retention, and it is nearly 3 weeks out from her initial day of hospitalization. With improvement in albumin and bilirubin (we are past the 7-day timeframe for a Lille score), we need to taper the prednisone more quickly. Please have clinical staff reach her by phone and have her drop the prednisone down to 20 mg once daily for 5 days, then 10 mg once daily for 5 days, then stop.   3 -agree with therapeutic paracentesis with albumin.     4 -if she is currently on furosemide 40 mg once daily and spironolactone 50 mg once daily as you recorded, then starting about 2 days after her paracentesis, increase the spironolactone to 100 mg daily.  We must proceed with caution on diuretic dose increases given her relatively low blood pressure.   5 -she needs a follow-up clinic visit in 4 to 6 weeks and a CMP in 2 weeks.   [name], MD

## 2024-02-18 NOTE — Telephone Encounter (Signed)
 No answer. No voicemail.

## 2024-02-19 NOTE — Telephone Encounter (Signed)
 No answer at the patient's listed telephone number.

## 2024-02-19 NOTE — Telephone Encounter (Signed)
 No answer on the telephone of significant other. Called listed number for Kamill Fulbright, grandfather. Got voicemail. Left message asking for Wonder and the number to call.

## 2024-02-19 NOTE — Telephone Encounter (Signed)
 No answer at patient phone. No voicemail.

## 2024-02-20 ENCOUNTER — Other Ambulatory Visit: Payer: Self-pay

## 2024-02-20 NOTE — Telephone Encounter (Signed)
 Letter mailed to the patient.  Will continue to attempt to contact by telephone.

## 2024-02-20 NOTE — Telephone Encounter (Signed)
 No answer. No voicemail at patient listed phone number.

## 2024-02-21 NOTE — Telephone Encounter (Signed)
Called the patient. No answer. No voicemail.

## 2024-02-21 NOTE — Telephone Encounter (Signed)
 Attempted to contact pt. No answer, phone rang and then went to busy signal. No way to leave vm.

## 2024-02-22 NOTE — Telephone Encounter (Signed)
 No answer. Rings and goes to fast busy signal. No voicemail.

## 2024-02-27 NOTE — Telephone Encounter (Signed)
 No answer. No voicemail. Phone rings and eventually goes to a fast busy. Letter was mailed to the patient on 02/20/24 asking she call us .

## 2024-03-19 NOTE — Progress Notes (Signed)
 Chief Complaint: Follow-up cirrhosis  HPI:    [name] is a 38 year old female, known to Dr. Legrand, with a past medical history as listed below, who presents to clinic today for follow-up of her alcoholic cirrhosis.    01/28/2024 patient consulted by our service for generalized abdominal pain and jaundice.  At that time discussed 2 weeks of constipation, alcohol use disorder causing elevated LFTs in a mixed pattern.  Clinical picture suggested cirrhosis with portal hypertension and ongoing alcohol use with marked hepatomegaly.  Also gallbladder stone.  Discussed abnormal imaging of the GI tract with a masslike abnormality the radiologist favored to be focal fatty sparing but recommended MRI.  Macrocytic anemia likely related to marrow suppression from alcohol use disorder.  Further workup was conducted including hep A, B and C serologies, B12, folic acid , iron studies, AFP and CMP.    01/28/2024 right upper quadrant ultrasound with acute cholecystitis, poor signal in the portal vein with questionable reversal of flow, worrisome for portal vein thrombosis, hepatic steatosis.    01/28/2024 CTAP with contrast for abdominal pain, nausea, vomiting and constipation with hepatomegaly and subtle nodular contour of the liver with irregular hypodensity throughout the liver parenchyma.  Masslike enhancement in the right hepatic lobe.  MRI recommended.  Small volume paraesophageal and perigastric varices with moderate volume ascites.  Consistent with portal hypertension.  Circumferential wall edema involving the decompressed gallbladder.  Mild wall thickening of the right colon likely due to underdistention and portal colopathy.    01/29/2024 hep A antibody reactive, hep C nonreactive, hep B surface antibody reactive, ammonia 50, CMP with a potassium of 3.4, sodium 129, AST 125, ALT 25, alk phos 153, total bili 12.5.    01/30/2024 EGD with small hiatal hernia, portal hypertensive gastropathy and congested  duodenal mucosa.  Patient started on Spironolactone  50 mg once a day and Furosemide  40 mg once daily.  There were no varices.    01/30/2024 IR-guided abdominal ultrasound with small volume ascites without pocket of fluid large enough to allow for safe approach for paracentesis.    01/31/2024 patient started on a prednisone  taper for alcoholic hepatitis, advised 40 mg daily for 14 days, then 30 mg for 5 days, then 20 mg for 5 days, then 10 mg for 5 days and stop.  aFP returned normal.  MRI was not pursued.    02/14/2024 office visit with me for follow-up of cirrhosis and alcoholic hepatitis.  At that time recommended MRI.  Patient sent to IR for paracentesis due to increased ascites and peripheral edema.  Continued on Lasix  40 mg daily and Spironolactone  50 mg daily.  Labs checked.  Continued prednisone  taper but this was tapered quickly, dropped prednisone  to 20 mg once daily for 5 days, then 10 mg once daily for 5 days and then stop.  Recommended she increase the Spironolactone  to 100 mg daily 2 days after her paracentesis.    02/18/2024 phone note from our nursing staff, unfortunately the patient had been tried to reach on multiple occasions with no answer and no voicemail so she was not given recommendations as above after labs.    02/18/2024 IR abdominal ultrasound with no ascites and no need for paracentesis.    Today, patient presents to clinic and tells me that she never received any of our messages.  She tapered herself off of Prednisone  and took her last dose about 1-1/2 weeks ago and all of her swelling is much better.  She continues on Lasix  40  mg daily and Spironolactone  50 mg daily.  She has also abstained from alcohol since October 20 and switched to nonalcoholic beers and beverages.  Does tell me that she has had a cough over the past couple of weeks and when she lays down to sleep at night she feels somewhat short of breath.  No fever or chills.    Denies weight loss or change in bowel  habits.  Past Medical History:  Diagnosis Date   Heart murmur     Past Surgical History:  Procedure Laterality Date   APPENDECTOMY     ESOPHAGOGASTRODUODENOSCOPY N/A 01/30/2024   Procedure: EGD (ESOPHAGOGASTRODUODENOSCOPY);  Surgeon: Erika Victory LITTIE DOUGLAS, MD;  Location: [facility] ENDOSCOPY;  Service: Gastroenterology;  Laterality: N/A;   TUBAL LIGATION      Current Outpatient Medications  Medication Sig Dispense Refill   EPINEPHrine  0.3 mg/0.3 mL IJ SOAJ injection Inject 0.3 mg into the muscle as needed for anaphylaxis. 2 each 1   folic acid  (FOLVITE ) 1 MG tablet Take 1 tablet (1 mg total) by mouth daily. 30 tablet 2   furosemide  (LASIX ) 20 MG tablet Take 2 tablets (40 mg total) by mouth daily. 60 tablet 1   Multiple Vitamin (MULTIVITAMIN WITH MINERALS) TABS tablet Take 1 tablet by mouth daily. 90 tablet 2   pantoprazole  (PROTONIX ) 40 MG tablet Take 1 tablet (40 mg total) by mouth daily. 30 tablet 1   spironolactone  (ALDACTONE ) 50 MG tablet Take 1 tablet (50 mg total) by mouth daily. 30 tablet 2   thiamine  (VITAMIN B1) 100 MG tablet Take 1 tablet (100 mg total) by mouth daily. 30 tablet 2   No current facility-administered medications for this visit.    Allergies as of 03/25/2024 - Review Complete 02/14/2024  Allergen Reaction Noted   Coconut (cocos nucifera) Other (See Comments) 08/26/2022   Dog epithelium Other (See Comments) 08/26/2022   Grass pollen(k-o-r-t-swt vern) Other (See Comments) 08/26/2022   Pollen extract Other (See Comments) 08/26/2022    No family history on file.  Social History   Socioeconomic History   Marital status: Single    Spouse name: Not on file   Number of children: Not on file   Years of education: Not on file   Highest education level: Not on file  Occupational History   Not on file  Tobacco Use   Smoking status: Every Day    Current packs/day: 1.50    Types: Cigarettes   Smokeless tobacco: Never  Vaping Use   Vaping status: Never Used   Substance and Sexual Activity   Alcohol use: Yes    Comment: Socail drink   Drug use: Yes    Types: Marijuana   Sexual activity: Not on file  Other Topics Concern   Not on file  Social History Narrative   ** Merged History Encounter **       Social Drivers of Health   Financial Resource Strain: Not on file  Food Insecurity: No Food Insecurity (01/29/2024)   Hunger Vital Sign    Worried About Running Out of Food in the Last Year: Never true    Ran Out of Food in the Last Year: Never true  Transportation Needs: No Transportation Needs (01/29/2024)   PRAPARE - Administrator, Civil Service (Medical): No    Lack of Transportation (Non-Medical): No  Physical Activity: Not on file  Stress: Not on file  Social Connections: Not on file  Intimate Partner Violence: Not At Risk (01/29/2024)   Humiliation,  Afraid, Rape, and Kick questionnaire    Fear of Current or Ex-Partner: No    Emotionally Abused: No    Physically Abused: No    Sexually Abused: No    Review of Systems:    Constitutional: No weight loss, fever, chills, weakness or fatigue HEENT: Eyes: No change in vision               Ears, Nose, Throat:  No change in hearing or congestion Skin: No rash or itching Cardiovascular: No chest pain, chest pressure or palpitations   Respiratory: No SOB or cough Gastrointestinal: See HPI and otherwise negative Genitourinary: No dysuria or change in urinary frequency Neurological: No headache, dizziness or syncope Musculoskeletal: No new muscle or joint pain Hematologic: No bleeding or bruising Psychiatric: No history of depression or anxiety    Physical Exam:  Vital signs: There were no vitals taken for this visit.  Constitutional:   Pleasant Caucasian female appears to be in NAD, Well developed, Well nourished, alert and cooperative Head:  Normocephalic and atraumatic. Eyes:   PEERL, EOMI. No icterus. Conjunctiva pink. Ears:  Normal auditory acuity. Neck:   Supple Throat: Oral cavity and pharynx without inflammation, swelling or lesion.  Respiratory: Respirations even and unlabored. Lungs clear to auscultation bilaterally.   No wheezes, crackles, or rhonchi.  Cardiovascular: Normal S1, S2. No MRG. Regular rate and rhythm. No peripheral edema, cyanosis or pallor.  Gastrointestinal:  Soft, nondistended, nontender. No rebound or guarding. Normal bowel sounds. No appreciable masses or hepatomegaly. Rectal:  Not performed.  Msk:  Symmetrical without gross deformities. Without edema, no deformity or joint abnormality.  Neurologic:  Alert and  oriented x4;  grossly normal neurologically.  Skin:   Dry and intact without significant lesions or rashes. Psychiatric: Oriented to person, place and time. Demonstrates good judgement and reason without abnormal affect or behaviors.  RELEVANT LABS AND IMAGING: CBC    Component Value Date/Time   WBC 9.7 02/14/2024 1408   RBC 2.52 (L) 02/14/2024 1408   HGB 9.0 Repeated and verified X2. (L) 02/14/2024 1408   HCT 26.6 (L) 02/14/2024 1408   PLT 204.0 02/14/2024 1408   MCV 105.4 (H) 02/14/2024 1408   MCH 35.6 (H) 01/31/2024 0300   MCHC 33.9 02/14/2024 1408   RDW 15.7 (H) 02/14/2024 1408   LYMPHSABS 1.0 02/14/2024 1408   MONOABS 0.7 02/14/2024 1408   EOSABS 0.0 02/14/2024 1408   BASOSABS 0.1 02/14/2024 1408    CMP     Component Value Date/Time   NA 134 (L) 02/14/2024 1408   K 3.8 02/14/2024 1408   CL 101 02/14/2024 1408   CO2 25 02/14/2024 1408   GLUCOSE 120 (H) 02/14/2024 1408   BUN 6 02/14/2024 1408   CREATININE 0.38 (L) 02/14/2024 1408   CALCIUM 8.8 02/14/2024 1408   PROT 6.9 02/14/2024 1408   ALBUMIN 2.9 (L) 02/14/2024 1408   AST 132 (H) 02/14/2024 1408   ALT 78 (H) 02/14/2024 1408   ALKPHOS 138 (H) 02/14/2024 1408   BILITOT 5.1 (H) 02/14/2024 1408   GFRNONAA >60 01/31/2024 0300    Assessment: 1.  Cirrhosis with portal hypertension: EGD 01/29/2024 with portal hypertensive gastropathy,  imaging suggestive of cirrhosis, found to have alcoholic hepatitis with discriminant function score 34 and started on steroids, increasing abdominal distention at last office visit 11/6, sent for ultrasound with paracentesis but there was not enough fluid to draw off, some of the fluid retention thought related to Prednisone  (supposed to be discharged on Prednisolone ),  patient has now tapered off of this over the past week and a half after not getting her messages of a quicker taper and her fluid level is much better, but does have some shortness of breath when laying down today and a cough; concern for pleural effusion/hepatic hydrothorax 2.  Macrocytic anemia: Thought multifactorial in setting of bone marrow suppression possible B12 deficiency, received 1 unit PRBCs in the hospital in October, last hemoglobin 9.0 on 11/6, rechecking labs today 3.  Alcoholism: At last office visit patient described no alcohol since discharge on 10/23, she is continue to abstain from alcohol 4.  Shortness of breath and cough: Patient describes a cough for the past couple of weeks which is not going away, continues to smoke, some shortness of breath when laying down; concern for pleural effusion versus infection versus other  Plan: 1.  Continue Lasix  40 mg daily #30 with 5 refills 2.  Continue Spironolactone  50 mg once daily for now, will likely increase to 100 mg daily in the near future pending labs from today. 3.  Repeat CBC and CMP with PT/INR today 4.  Congratulated the patient on being alcohol free over the past almost 2 months. 5.  Somewhat worried about her shortness of breath with laying down and cough, concern for pleural effusion.  Ordered chest x-ray today. 6.  Referred the patient to a PCP. 7.  Recall that at time of last admission patient had normal imaging of the liver but AFP was normal so MRI was not pursued.  Dr. Legrand did recommend trying to get this done for the patient.  She is currently without  insurance and unable to afford. 8.  Patient to follow in clinic per recommendations after labs above.  Delon Failing, PA-C [facility] 03/19/2024, 11:00 AM

## 2024-03-25 ENCOUNTER — Ambulatory Visit (INDEPENDENT_AMBULATORY_CARE_PROVIDER_SITE_OTHER): Payer: Self-pay | Admitting: Physician Assistant

## 2024-03-25 ENCOUNTER — Ambulatory Visit: Payer: Self-pay | Admitting: Physician Assistant

## 2024-03-25 ENCOUNTER — Encounter: Payer: Self-pay | Admitting: Physician Assistant

## 2024-03-25 ENCOUNTER — Other Ambulatory Visit: Payer: Self-pay

## 2024-03-25 ENCOUNTER — Ambulatory Visit (INDEPENDENT_AMBULATORY_CARE_PROVIDER_SITE_OTHER)
Admission: RE | Admit: 2024-03-25 | Discharge: 2024-03-25 | Disposition: A | Payer: Self-pay | Source: Ambulatory Visit | Attending: Physician Assistant

## 2024-03-25 VITALS — BP 128/70 | HR 85 | Ht 67.0 in | Wt 146.0 lb

## 2024-03-25 DIAGNOSIS — K7031 Alcoholic cirrhosis of liver with ascites: Secondary | ICD-10-CM

## 2024-03-25 DIAGNOSIS — R0602 Shortness of breath: Secondary | ICD-10-CM

## 2024-03-25 DIAGNOSIS — R051 Acute cough: Secondary | ICD-10-CM

## 2024-03-25 DIAGNOSIS — K766 Portal hypertension: Secondary | ICD-10-CM

## 2024-03-25 DIAGNOSIS — R059 Cough, unspecified: Secondary | ICD-10-CM

## 2024-03-25 DIAGNOSIS — K7469 Other cirrhosis of liver: Secondary | ICD-10-CM

## 2024-03-25 DIAGNOSIS — F1021 Alcohol dependence, in remission: Secondary | ICD-10-CM

## 2024-03-25 LAB — COMPREHENSIVE METABOLIC PANEL WITH GFR
ALT: 41 U/L — ABNORMAL HIGH (ref 3–35)
AST: 92 U/L — ABNORMAL HIGH (ref 5–37)
Albumin: 3.7 g/dL (ref 3.5–5.2)
Alkaline Phosphatase: 102 U/L (ref 39–117)
BUN: 10 mg/dL (ref 6–23)
CO2: 26 meq/L (ref 19–32)
Calcium: 9.5 mg/dL (ref 8.4–10.5)
Chloride: 98 meq/L (ref 96–112)
Creatinine, Ser: 0.5 mg/dL (ref 0.40–1.20)
GFR: 120.06 mL/min (ref >60.00)
Glucose, Bld: 115 mg/dL — ABNORMAL HIGH (ref 70–99)
Potassium: 3.5 meq/L (ref 3.5–5.1)
Sodium: 136 meq/L (ref 135–145)
Total Bilirubin: 3.5 mg/dL — ABNORMAL HIGH (ref 0.2–1.2)
Total Protein: 8.3 g/dL (ref 6.0–8.3)

## 2024-03-25 LAB — CBC WITH DIFFERENTIAL/PLATELET
Basophils Absolute: 0.1 K/uL (ref 0.0–0.1)
Basophils Relative: 0.9 % (ref 0.0–3.0)
Eosinophils Absolute: 0.1 K/uL (ref 0.0–0.7)
Eosinophils Relative: 1.8 % (ref 0.0–5.0)
HCT: 33.5 % — ABNORMAL LOW (ref 36.0–46.0)
Hemoglobin: 11.3 g/dL — ABNORMAL LOW (ref 12.0–15.0)
Lymphocytes Relative: 17 % (ref 12.0–46.0)
Lymphs Abs: 1.4 K/uL (ref 0.7–4.0)
MCHC: 33.7 g/dL (ref 30.0–36.0)
MCV: 96.4 fl (ref 78.0–100.0)
Monocytes Absolute: 0.7 K/uL (ref 0.1–1.0)
Monocytes Relative: 8.8 % (ref 3.0–12.0)
Neutro Abs: 5.7 K/uL (ref 1.4–7.7)
Neutrophils Relative %: 71.5 % (ref 43.0–77.0)
Platelets: 170 K/uL (ref 150.0–400.0)
RBC: 3.48 Mil/uL — ABNORMAL LOW (ref 3.87–5.11)
RDW: 15.1 % (ref 11.5–15.5)
WBC: 8 K/uL (ref 4.0–10.5)

## 2024-03-25 LAB — PROTIME-INR
INR: 1.6 ratio — ABNORMAL HIGH (ref 0.8–1.0)
Prothrombin Time: 16.1 s — ABNORMAL HIGH (ref 9.6–13.1)

## 2024-03-25 MED ORDER — FUROSEMIDE 20 MG PO TABS: 40.0000 mg | ORAL_TABLET | Freq: Every day | ORAL | 3 refills | Status: AC

## 2024-03-25 MED ORDER — SPIRONOLACTONE 50 MG PO TABS: 50.0000 mg | ORAL_TABLET | Freq: Every day | ORAL | 3 refills | Status: AC

## 2024-03-25 NOTE — Progress Notes (Signed)
 Please call the patient and let her know that the x-ray today did not show any sign of fluid on her lungs or an infection.  This is likely multifactorial in the setting of her smoking.  I would like her to establish with a PCP though.  Awaiting labs from today to discuss increase in diuretics.  Thanks, JL L

## 2024-03-25 NOTE — Patient Instructions (Addendum)
 Your provider has requested that you have an chest x ray before leaving today. Please go to the basement floor to our Radiology department for the test.  Your provider has requested that you go to the basement level for lab work before leaving today. Press B on the elevator. The lab is located at the first door on the left as you exit the elevator.   Referral placed to primary care provider.  We have sent the following medications to your pharmacy for you to pick up at your convenience: Lasix , Spironolactone    _______________________________________________________  If your blood pressure at your visit was 140/90 or greater, please contact your primary care physician to follow up on this.  _______________________________________________________  If you are age 37 or older, your body mass index should be between 23-30. Your Body mass index is 22.87 kg/m. If this is out of the aforementioned range listed, please consider follow up with your Primary Care Provider.  If you are age 37 or younger, your body mass index should be between 19-25. Your Body mass index is 22.87 kg/m. If this is out of the aformentioned range listed, please consider follow up with your Primary Care Provider.   ________________________________________________________  The [facility] providers would like to encourage you to use MYCHART to communicate with providers for non-urgent requests or questions.  Due to long hold times on the telephone, sending your provider a message by [facility] may be a faster and more efficient way to get a response.  Please allow 48 business hours for a response.  Please remember that this is for non-urgent requests.  _______________________________________________________  [facility] is using a team-based approach to care.  Your team is made up of your doctor and two to three APPS. Our APPS (Nurse Practitioners and Physician Assistants) work with your physician to ensure care  continuity for you. They are fully qualified to address your health concerns and develop a treatment plan. They communicate directly with your gastroenterologist to care for you. Seeing the Advanced Practice Practitioners on your physician's team can help you by facilitating care more promptly, often allowing for earlier appointments, access to diagnostic testing, procedures, and other specialty referrals.

## 2024-03-25 NOTE — Progress Notes (Signed)
 Please call the patient and have her increase Spironolactone  to 100mg  a day (2 50mg  tabs)-send new prescription please.  Continue Lasix  40mg  every day. Needs repeat BMP one week after increasing Spironolactone .   Her labs look stable and slightly improved. She needs follow up with Dr. Legrand in 2 months.  New MELD-NA is 17.  Thanks-JLL

## 2024-03-26 ENCOUNTER — Other Ambulatory Visit: Payer: Self-pay | Admitting: *Deleted

## 2024-03-26 MED ORDER — SPIRONOLACTONE 50 MG PO TABS: 100.0000 mg | ORAL_TABLET | Freq: Every day | ORAL | 3 refills | Status: AC

## 2024-03-26 NOTE — Progress Notes (Signed)
 ____________________________________________________________  Attending physician addendum:  Thank you for sending this case to me. I have reviewed the entire note and agree with the plan.  Excellent comprehensive review on this patient. In addition, she needs a primary care provider for her respiratory issues and other general medicine needs. The  internal medicine resident clinic would be appropriate for patient currently without coverage.  Victory Brand, MD  ____________________________________________________________

## 2024-04-15 ENCOUNTER — Ambulatory Visit (HOSPITAL_BASED_OUTPATIENT_CLINIC_OR_DEPARTMENT_OTHER): Payer: Self-pay

## 2024-04-15 ENCOUNTER — Encounter (HOSPITAL_BASED_OUTPATIENT_CLINIC_OR_DEPARTMENT_OTHER): Payer: Self-pay

## 2024-04-15 VITALS — BP 112/71 | HR 85 | Ht 67.0 in | Wt 145.8 lb

## 2024-04-15 DIAGNOSIS — Z7689 Persons encountering health services in other specified circumstances: Secondary | ICD-10-CM

## 2024-04-15 DIAGNOSIS — K7031 Alcoholic cirrhosis of liver with ascites: Secondary | ICD-10-CM | POA: Diagnosis not present

## 2024-04-15 DIAGNOSIS — Z720 Tobacco use: Secondary | ICD-10-CM | POA: Diagnosis not present

## 2024-04-15 NOTE — Progress Notes (Signed)
 "   New Patient Office Visit  Subjective:   Erika Morris Sep 23, 1985 04/15/2024  Chief Complaint  Patient presents with   New Patient (Initial Visit)    Patient is here to get established with PCP. Denies any concerns for today's visit.     HPI: Erika Morris presents today to establish care at [facility] at [facility]. Introduced to publishing rights manager role and practice setting with verbalized understanding by patient.  All questions answered.   Last PCP: never Last annual physical: never Concerns: See below   Cirrhosis of the liver: States she was diagnosed in October of 2025 and has not drank since. States since she has been followed by gastroenterology but has been told she needs a PCP. Denies any abdominal pain, NVD.  Smoking Cessation: Pt states she is interested in quitting smoking when she hits her 6 month sobriety (around April 2026). States she would like to discuss options at that time but is not open to quitting today.  The following portions of the patient's history were reviewed and updated as appropriate: past medical history, past surgical history, family history, social history, allergies, medications, and problem list.   Patient Active Problem List   Diagnosis Date Noted   Macrocytic anemia 01/30/2024   Alcoholic cirrhosis of liver with ascites (HCC) 01/30/2024   Acute GI bleeding 01/28/2024   Past Medical History:  Diagnosis Date   Heart murmur    Past Surgical History:  Procedure Laterality Date   APPENDECTOMY     ESOPHAGOGASTRODUODENOSCOPY N/A 01/30/2024   Procedure: EGD (ESOPHAGOGASTRODUODENOSCOPY);  Surgeon: Legrand Victory LITTIE DOUGLAS, MD;  [facility] END[facility];  Service: Gastroenterology;  Laterality: N/A;   TUBAL LIGATION     Family History  Problem Relation Age of Onset   Bipolar disorder Mother    Lumbar disc disease Mother    Alcoholism Mother    Social History   Socioeconomic History   Marital status:  Single    Spouse name: Not on file   Number of children: 2   Years of education: Not on file   Highest education level: Not on file  Occupational History   Not on file  Tobacco Use   Smoking status: Every Day    Current packs/day: 1.50    Types: Cigarettes   Smokeless tobacco: Never  Vaping Use   Vaping status: Never Used  Substance and Sexual Activity   Alcohol use: Not Currently    Comment: Socail drink   Drug use: Yes    Types: Marijuana   Sexual activity: Not on file  Other Topics Concern   Not on file  Social History Narrative   ** Merged History Encounter **       Social Drivers of Health   Tobacco Use: High Risk (04/15/2024)   Patient History    Smoking Tobacco Use: Every Day    Smokeless Tobacco Use: Never    Passive Exposure: Not on file  Financial Resource Strain: Low Risk (04/15/2024)   Overall Financial Resource Strain (CARDIA)    Difficulty of Paying Living Expenses: Not hard at all  Food Insecurity: No Food Insecurity (04/15/2024)   Epic    Worried About Radiation Protection Practitioner of Food in the Last Year: Never true    Ran Out of Food in the Last Year: Never true  Transportation Needs: No Transportation Needs (04/15/2024)   Epic    Lack of Transportation (Medical): No    Lack of Transportation (Non-Medical): No  Physical Activity:  Inactive (04/15/2024)   Exercise Vital Sign    Days of Exercise per Week: 0 days    Minutes of Exercise per Session: 0 min  Stress: No Stress Concern Present (04/15/2024)   [employer] of Occupational Health - Occupational Stress Questionnaire    Feeling of Stress: Not at all  Social Connections: Moderately Isolated (04/15/2024)   Social Connection and Isolation Panel    Frequency of Communication with Friends and Family: More than three times a week    Frequency of Social Gatherings with Friends and Family: More than three times a week    Attends Religious Services: Never    Database Administrator or Organizations: No    Attends Tax Inspector Meetings: Never    Marital Status: Living with partner  Intimate Partner Violence: Not At Risk (04/15/2024)   Epic    Fear of Current or Ex-Partner: No    Emotionally Abused: No    Physically Abused: No    Sexually Abused: No  Depression (PHQ2-9): High Risk (04/15/2024)   Depression (PHQ2-9)    PHQ-2 Score: 13  Alcohol Screen: Low Risk (04/15/2024)   Alcohol Screen    Last Alcohol Screening Score (AUDIT): 0  Housing: Low Risk (04/15/2024)   Epic    Unable to Pay for Housing in the Last Year: No    Number of Times Moved in the Last Year: 0    Homeless in the Last Year: No  Utilities: Not At Risk (04/15/2024)   Epic    Threatened with loss of utilities: No  Health Literacy: Adequate Health Literacy (04/15/2024)   B1300 Health Literacy    Frequency of need for help with medical instructions: Never   Outpatient Medications Prior to Visit  Medication Sig Dispense Refill   EPINEPHrine  0.3 mg/0.3 mL IJ SOAJ injection Inject 0.3 mg into the muscle as needed for anaphylaxis. 2 each 1   folic acid  (FOLVITE ) 1 MG tablet Take 1 tablet (1 mg total) by mouth daily. 30 tablet 2   furosemide  (LASIX ) 20 MG tablet Take 2 tablets (40 mg total) by mouth daily. 60 tablet 3   Multiple Vitamin (MULTIVITAMIN WITH MINERALS) TABS tablet Take 1 tablet by mouth daily. 90 tablet 2   pantoprazole  (PROTONIX ) 40 MG tablet Take 1 tablet (40 mg total) by mouth daily. 30 tablet 1   Potassium Gluconate 550 MG TABS Take 550 tablets by mouth daily.     spironolactone  (ALDACTONE ) 50 MG tablet Take 2 tablets (100 mg total) by mouth daily. 60 tablet 3   thiamine  (VITAMIN B1) 100 MG tablet Take 1 tablet (100 mg total) by mouth daily. (Patient not taking: Reported on 04/15/2024) 30 tablet 2   No facility-administered medications prior to visit.   Allergies[1]  ROS: A complete ROS was performed with pertinent positives/negatives noted in the HPI. The remainder of the ROS are negative.   Objective:   Today's Vitals    04/15/24 1301  BP: 112/71  Pulse: 85  SpO2: 100%  Weight: 145 lb 12.8 oz (66.1 kg)  Height: 5' 7 (1.702 m)    GENERAL: Well-appearing, in NAD. Well nourished. RESPIRATORY: Chest wall symmetrical. Respirations even and non-labored. Breath sounds clear to auscultation bilaterally.  CARDIAC: S1, S2 present, regular rate and rhythm without murmur or gallops. Peripheral pulses 2+ bilaterally.  MSK: Muscle tone and strength appropriate for age. Joints w/o tenderness, redness, or swelling.  EXTREMITIES: Without clubbing, cyanosis, or edema.  NEUROLOGIC: No motor or sensory deficits. Steady, even  gait. C2-C12 intact.  PSYCH/MENTAL STATUS: Alert, oriented x 3. Cooperative, appropriate mood and affect.    Health Maintenance Due  Topic Date Due   Pneumococcal Vaccine (1 of 2 - PCV) Never done   Hepatitis B Vaccines 19-59 Average Risk (1 of 3 - 19+ 3-dose series) Never done   HPV VACCINES (1 - 3-dose SCDM series) Never done   Cervical Cancer Screening (HPV/Pap Cotest)  Never done   COVID-19 Vaccine (1 - 2025-26 season) Never done    No results found for any visits on 04/15/24.     Assessment & Plan:  1. Encounter to establish care with new doctor Discussed role of NP and expectations of the Primary Care Clinic. Discussed medical, surgical, and family history.   2. Alcoholic cirrhosis of liver with ascites (HCC) (Primary) Closely followed and managed by gastroenterology.  3. Declined smoking cessation Discussed risks of continuing to smoke. Pt verbalized understanding but declined cessation at today's visit. Discussed looking into options for cessation around April   Patient to reach out to office if new, worrisome, or unresolved symptoms arise or if no improvement in patient's condition. Patient verbalized understanding and is agreeable to treatment plan. All questions answered to patient's satisfaction.    Return in about 3 months (around 07/14/2024) for annual physical (fasting lab  at visit).    Lauraine Almarie Angus DNP, FNP-C     [1]  Allergies Allergen Reactions   Coconut (Cocos Nucifera) Other (See Comments)    Reaction??   Dog Epithelium Other (See Comments)    Sneezing    Grass Pollen(K-O-R-T-Swt Vern) Other (See Comments)    Sneezing    Pollen Extract Other (See Comments)    Sneezing    "

## 2024-04-18 NOTE — Telephone Encounter (Signed)
 Inbound call from patient stating that she is returning a call back to [facility] from back in December. Patient is requesting a call back in regards to her results.

## 2024-07-15 ENCOUNTER — Encounter (HOSPITAL_BASED_OUTPATIENT_CLINIC_OR_DEPARTMENT_OTHER)
